# Patient Record
Sex: Female | Born: 1955 | Race: White | Hispanic: No | Marital: Married | Smoking: Former smoker
Health system: Southern US, Community
[De-identification: ages and names within clinical notes are randomized; demographics above are authoritative.]

## PROBLEM LIST (undated history)

## (undated) DIAGNOSIS — F32A Depression, unspecified: Secondary | ICD-10-CM

## (undated) DIAGNOSIS — K219 Gastro-esophageal reflux disease without esophagitis: Secondary | ICD-10-CM

## (undated) DIAGNOSIS — I10 Essential (primary) hypertension: Secondary | ICD-10-CM

## (undated) DIAGNOSIS — J302 Other seasonal allergic rhinitis: Secondary | ICD-10-CM

## (undated) DIAGNOSIS — F329 Major depressive disorder, single episode, unspecified: Secondary | ICD-10-CM

## (undated) DIAGNOSIS — M810 Age-related osteoporosis without current pathological fracture: Secondary | ICD-10-CM

## (undated) DIAGNOSIS — Z9109 Other allergy status, other than to drugs and biological substances: Secondary | ICD-10-CM

## (undated) DIAGNOSIS — T7840XA Allergy, unspecified, initial encounter: Secondary | ICD-10-CM

## (undated) DIAGNOSIS — M1990 Unspecified osteoarthritis, unspecified site: Secondary | ICD-10-CM

## (undated) DIAGNOSIS — Z973 Presence of spectacles and contact lenses: Secondary | ICD-10-CM

## (undated) HISTORY — DX: Allergy, unspecified, initial encounter: T78.40XA

## (undated) HISTORY — PX: OTHER SURGICAL HISTORY: SHX169

## (undated) HISTORY — PX: LAPAROSCOPIC ENDOMETRIOSIS FULGURATION: SUR769

## (undated) HISTORY — PX: POLYPECTOMY: SHX149

## (undated) HISTORY — DX: Age-related osteoporosis without current pathological fracture: M81.0

## (undated) HISTORY — DX: Other allergy status, other than to drugs and biological substances: Z91.09

## (undated) HISTORY — PX: APPENDECTOMY: SHX54

## (undated) HISTORY — PX: COLONOSCOPY: SHX174

---

## 2004-10-08 ENCOUNTER — Emergency Department (HOSPITAL_COMMUNITY): Admission: EM | Admit: 2004-10-08 | Discharge: 2004-10-08 | Payer: Self-pay | Admitting: Emergency Medicine

## 2004-10-09 ENCOUNTER — Observation Stay (HOSPITAL_COMMUNITY): Admission: RE | Admit: 2004-10-09 | Discharge: 2004-10-10 | Payer: Self-pay | Admitting: Orthopedic Surgery

## 2005-02-22 ENCOUNTER — Other Ambulatory Visit: Admission: RE | Admit: 2005-02-22 | Discharge: 2005-02-22 | Payer: Self-pay | Admitting: Obstetrics and Gynecology

## 2005-02-24 ENCOUNTER — Ambulatory Visit (HOSPITAL_COMMUNITY): Admission: RE | Admit: 2005-02-24 | Discharge: 2005-02-24 | Payer: Self-pay | Admitting: Obstetrics and Gynecology

## 2006-08-17 IMAGING — CR DG WRIST 2V*L*
2 series · 2 of 2 positions shown · non-contrast
Comparison: 10/08/04.

CLINICAL DATA: Postop left wrist fracture.  
 LEFT WRIST ? 2 VIEWS:

[view not recorded (1 of 2)]
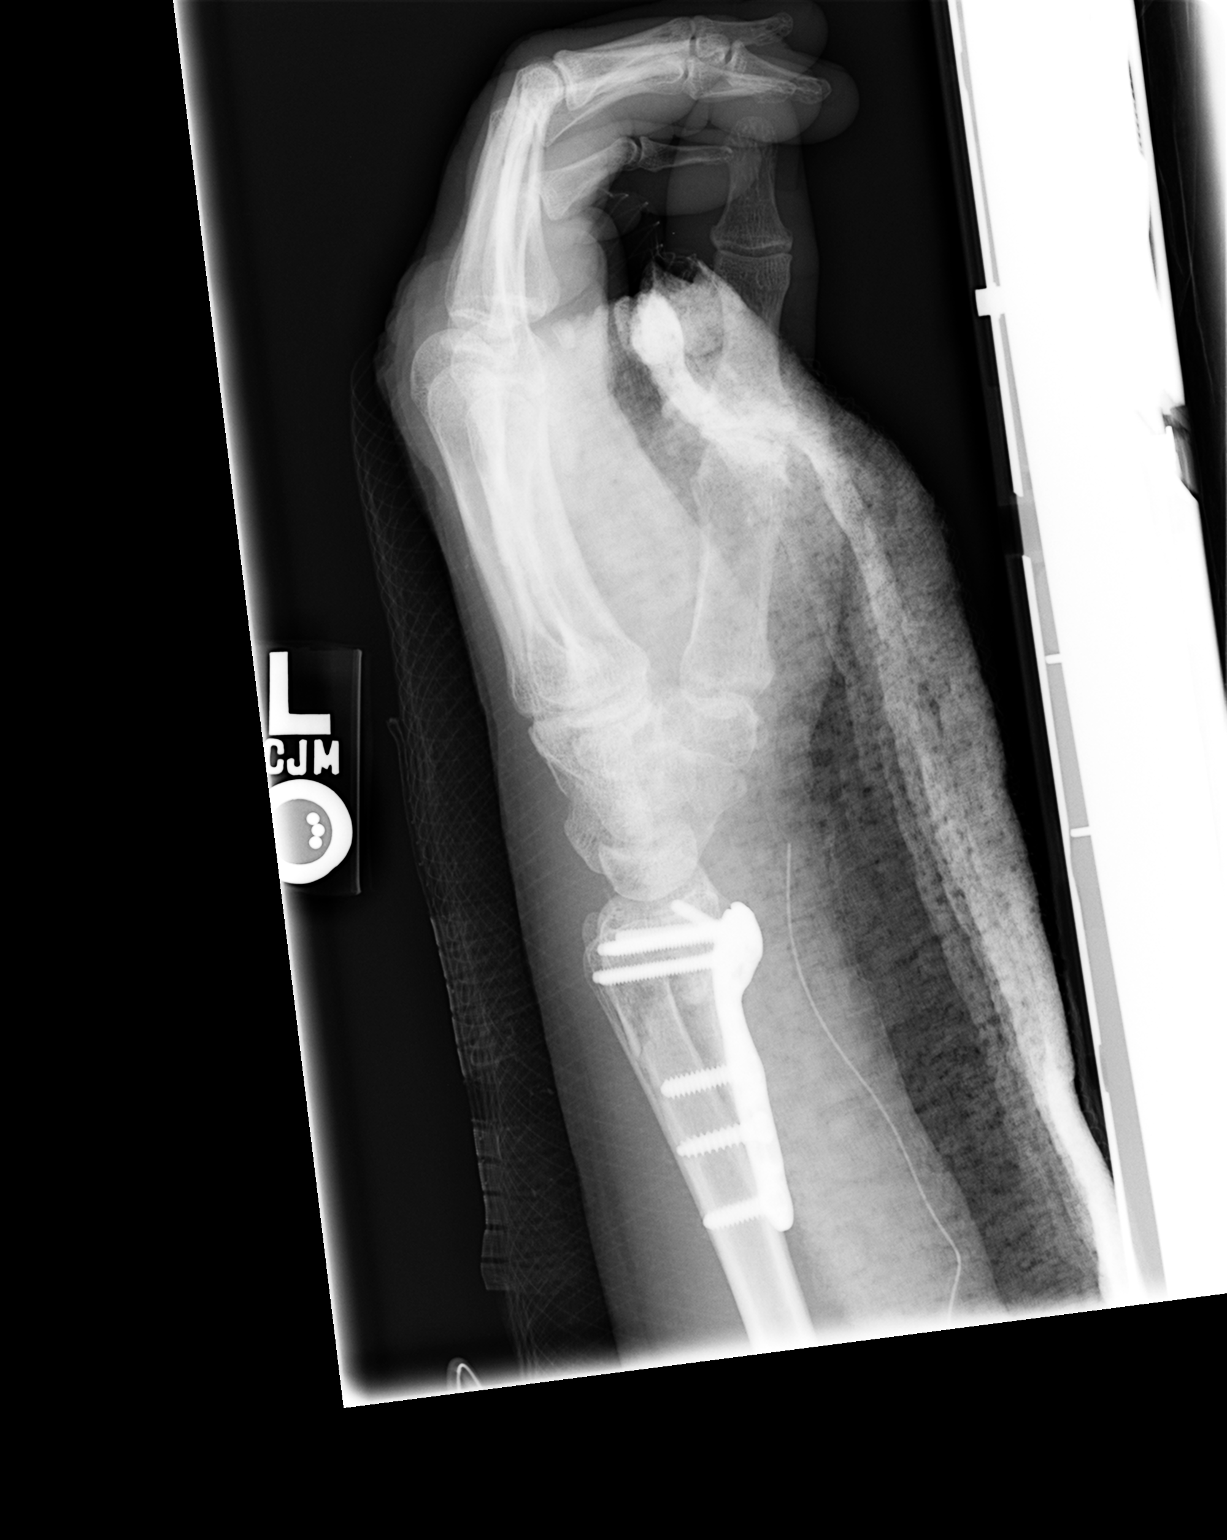

[view not recorded (2 of 2)]
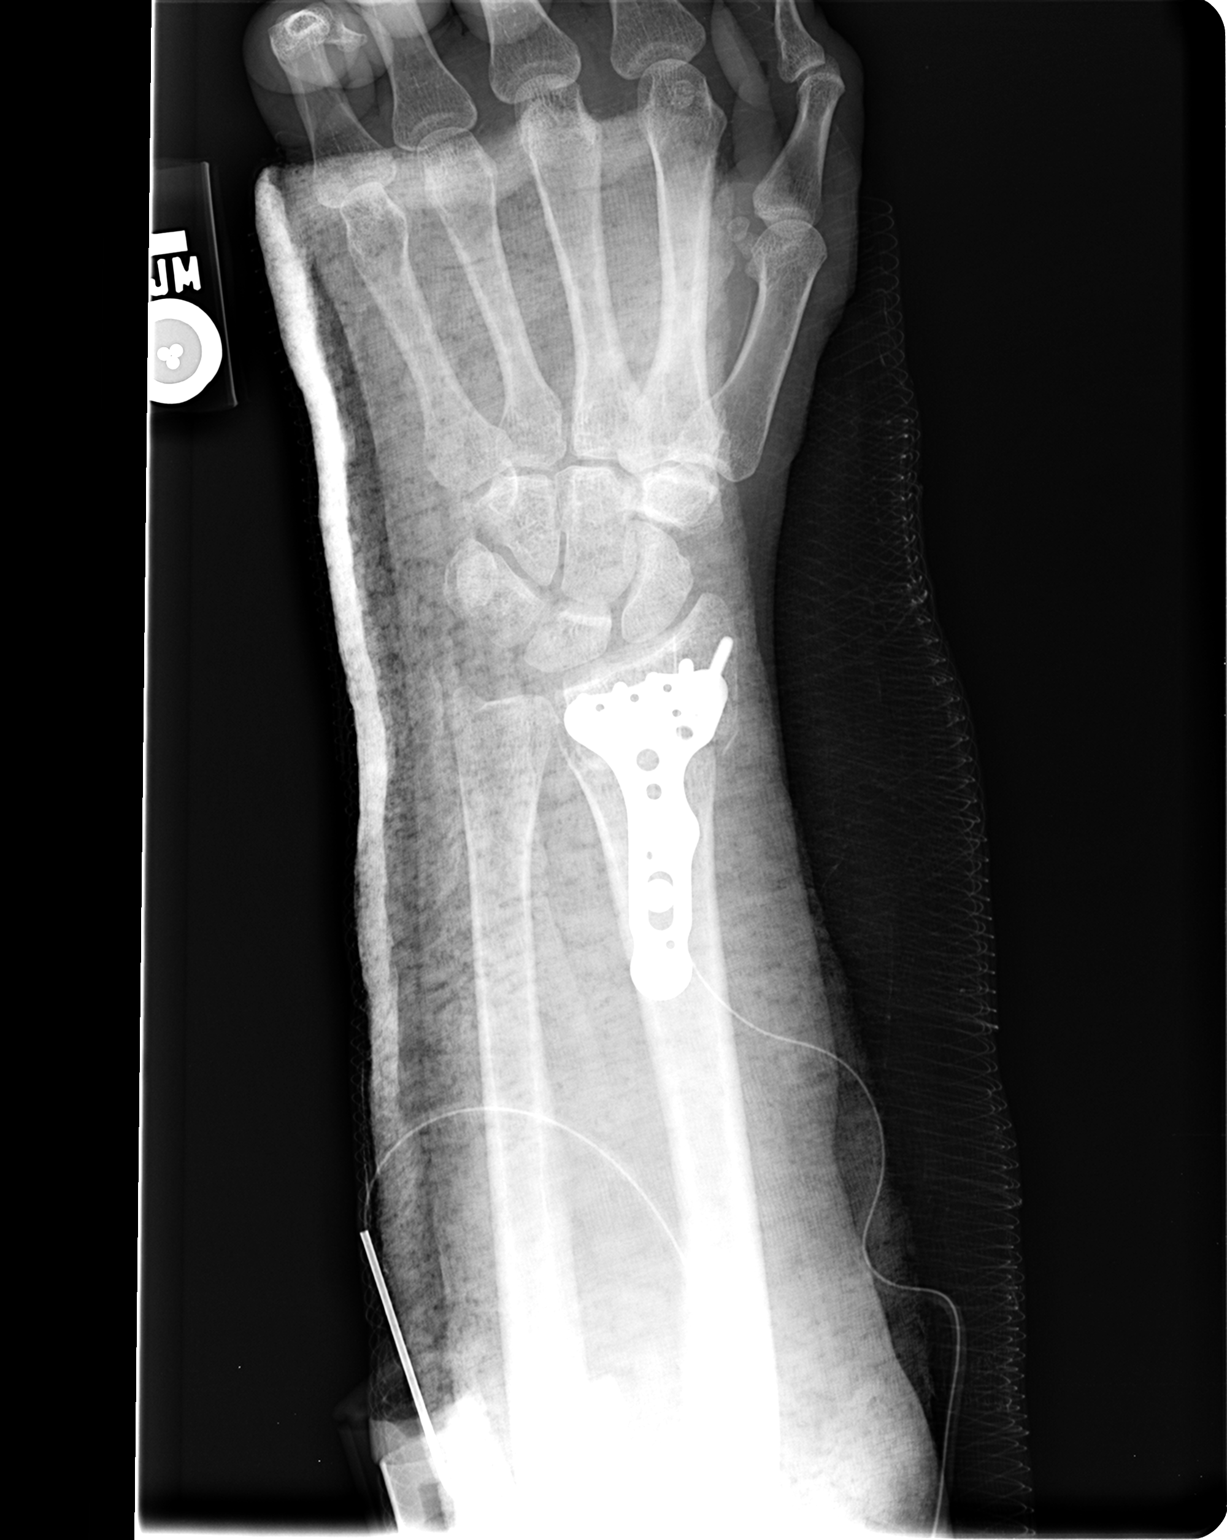

[2 of 2 positions shown; findings below may reference images not displayed]

FINDINGS: There has been interval plate and screw fixation of a distal radius fracture with fracture fragments in near anatomic alignment.  Fine osseous detail is degraded by overlying plaster splint.
IMPRESSION: Interval plate and screw fixation of distal radius fracture as above.

## 2009-12-24 ENCOUNTER — Ambulatory Visit (HOSPITAL_COMMUNITY): Admission: RE | Admit: 2009-12-24 | Discharge: 2009-12-24 | Payer: Self-pay | Admitting: Obstetrics and Gynecology

## 2010-03-04 ENCOUNTER — Encounter
Admission: RE | Admit: 2010-03-04 | Discharge: 2010-03-04 | Payer: Self-pay | Source: Home / Self Care | Attending: Sports Medicine | Admitting: Sports Medicine

## 2010-06-26 NOTE — Consult Note (Signed)
[name], FLORENDO               ACCOUNT NO.:  [id number]   MEDICAL RECORD NO.:  [id number]          PATIENT TYPE:  EMS   LOCATION:  MAJO                         FACILITY:  [facility]   PHYSICIAN:  Burnard Bunting, M.D.    DATE OF BIRTH:  14-Nov-1955   DATE OF CONSULTATION:  10/08/2004  DATE OF DISCHARGE:  10/08/2004                                   CONSULTATION   REQUESTING PHYSICIAN:  Vanessa Mulders, MD   CHIEF COMPLAINT:  Left wrist pain.   HISTORY OF PRESENT ILLNESS:  Vanessa Patel is a 55 year old left hand  dominant female who just moved from [place] who fell down the steps and  injured her left wrist approximately three hours ago.  She denies any other  orthopedic complaints.  She reports wrist pain deformity with mild tingling  in her fingers.  She has no known drug allergies.   CURRENT MEDICATIONS:  Prozac, triamterene, Allegra, Singulair, and Fosamax.   PAST MEDICAL HISTORY:  Osteoporosis.   PAST SURGICAL HISTORY:  Appendectomy.   REVIEW OF SYSTEMS:  Unremarkable.  She just moved here to [place] with  her husband.  She is in temporary work at [employer].   PHYSICAL EXAMINATION:  VITAL SIGNS:  Temperature 97.4, heart rate 73, blood  pressure 122/59, respirations 18, pulse ox 99% on room air.  EXTREMITIES:  Her left wrist has obvious deformity.  She has palpable radial  pulse.  Her fingers are perfuse, although cap refill is delayed to 2-3  seconds.  She has good ETL __________  interosseous function.  She has full  elbow and shoulder range of motion.   Radiographs demonstrated dorsally angulated distal radius fracture.   IMPRESSION:  Dorsally angulated distal radius fracture dominant hand.   PLAN:  Closed reduction is performed tonight under conscious sedation.  After hematoma block of 5 mL of lidocaine is administered manual reduction  is performed with morphine and Versed on-board.  Post reduction radiographs  show restoration of radial height and alignment,  although the fracture  itself was highly unstable.  At this time plan is for volar plating in one  to two days to allow swelling to decrease.  Risks and benefits of that  procedure  also discussed with the patient and her husband.  They include, but are not  limited to, infection, nerve/vessel damage, non-union, malunion.  Patient  understands and will proceed on Friday with open reduction internal  fixation.  Note that sensory function remained intact following the  reduction.           ______________________________  G. Dorene Grebe, M.D.     GSD/MEDQ  D:  10/08/2004  T:  10/08/2004  Job:  [id number]

## 2010-06-26 NOTE — Op Note (Signed)
NAME:  Vanessa Patel, Vanessa Patel               ACCOUNT NO.:  [id number]   MEDICAL RECORD NO.:  [id number]          PATIENT TYPE:  OBV   LOCATION:  5008                         FACILITY:  [facility]   PHYSICIAN:  Burnard Bunting, M.D.    DATE OF BIRTH:  [birth year]   DATE OF PROCEDURE:  10/09/2004  DATE OF DISCHARGE:                                 OPERATIVE REPORT   PREOPERATIVE DIAGNOSIS:  Left distal radius fracture.   POSTOPERATIVE DIAGNOSIS:  Left distal radius fracture.   PROCEDURE:  Left distal radius open reduction/internal fixation.   SURGEON:  Burnard Bunting, M.D.   ASSISTANT:  None.   ANESTHESIA:  Regional.   TOURNIQUET TIME:  Sixty-one minutes at 250 mmHg.   PROCEDURE IN DETAIL:  Patient was brought to the operating room, where  general endotracheal anesthesia was induced.  Preoperative antibiotics were  administered.  The left arm was then prepped with DuraPrep solution and  draped in a sterile manner.  The arm was elevated, and exsanguinated with  the esmarch wrap.  The tourniquet was inflated.  The operative field was  covered with Ioban.  An incision was made along the SCR tendon.  Skin and  subcutaneous tissue were sharply divided.  Crossing veins were ligated with  bipolar electrocautery.  The floor of the SCR sheath was also divided.  Pronator quadratus attachment to the radial aspect of the distal radius was  then detached.  Subperiosteal elevation was performed to visualize the  fracture site.  The radial artery and SCR tendon were retracted radially.  The fracture site was visualized.  The fracture was unstable but reducible.  The fracture was reduced, and then Accumed plate was then applied with a  bicortical screw through the sliding proximal slot hole.  Optimal placements  of the plate on the bone with reduction in places achieved, and the screw  was tightened.  While maintaining the reduction, two screws were placed into  the distal fragment.  These were locking screws  into the plate.  Reduction  was again confirmed in the AP and lateral planes under fluoroscopy, and it  was found to have restoration of radial height __________ in lower tilt.  At  this time, the remaining distal fragment screw holes were filled with  locking screws.  Two proximal locking screws were also placed.  The  tourniquet was released at this time.  The incision was thoroughly  irrigated.  The wrist was taken through a range of motion, flexion,  extension, pronation, and supination and were found to have full motion.  The incision was then closed over a TLS drain using __________ inverted 3-0  Vicryl suture and 3-0 nylon suture.  A well-padded volar splint was applied.  The patient tolerated the procedure well without immediate complications.  She was transferred to the recovery room in stable condition.          ______________________________  G. Dorene Grebe, M.D.    GSD/MEDQ  D:  10/09/2004  T:  10/10/2004  Job:  [id number]

## 2010-11-12 ENCOUNTER — Encounter: Payer: Self-pay | Admitting: Internal Medicine

## 2010-11-23 ENCOUNTER — Ambulatory Visit (AMBULATORY_SURGERY_CENTER): Payer: BC Managed Care – PPO

## 2010-11-23 ENCOUNTER — Encounter: Payer: Self-pay | Admitting: Internal Medicine

## 2010-11-23 VITALS — Ht 62.0 in | Wt 135.0 lb

## 2010-11-23 DIAGNOSIS — Z1211 Encounter for screening for malignant neoplasm of colon: Secondary | ICD-10-CM

## 2010-11-23 MED ORDER — PEG-KCL-NACL-NASULF-NA ASC-C 100 G PO SOLR: 1.0000 | Freq: Once | ORAL | Status: DC

## 2010-12-07 ENCOUNTER — Encounter: Payer: Self-pay | Admitting: Internal Medicine

## 2010-12-07 ENCOUNTER — Ambulatory Visit (AMBULATORY_SURGERY_CENTER): Payer: BC Managed Care – PPO | Admitting: Internal Medicine

## 2010-12-07 DIAGNOSIS — D128 Benign neoplasm of rectum: Secondary | ICD-10-CM

## 2010-12-07 DIAGNOSIS — D129 Benign neoplasm of anus and anal canal: Secondary | ICD-10-CM

## 2010-12-07 DIAGNOSIS — Z1211 Encounter for screening for malignant neoplasm of colon: Secondary | ICD-10-CM

## 2010-12-07 DIAGNOSIS — D126 Benign neoplasm of colon, unspecified: Secondary | ICD-10-CM

## 2010-12-07 MED ORDER — SODIUM CHLORIDE 0.9 % IV SOLN: 500.0000 mL | INTRAVENOUS | Status: DC

## 2010-12-07 NOTE — Patient Instructions (Signed)
FOLLOW DISCHARGE INSTRUCTIONS (BLUE & GREEN SHEETS).   INFORMATION ON POLYPS, DIVERTICULOSIS, & HIGH FIBER DIET GIVEN TO YOU   AWAIT LETTER FROM DR PYRTLE WITH PATHOLOGY RESULTS.

## 2010-12-08 ENCOUNTER — Telehealth: Payer: Self-pay | Admitting: *Deleted

## 2010-12-08 NOTE — Telephone Encounter (Signed)

## 2010-12-13 ENCOUNTER — Encounter: Payer: Self-pay | Admitting: Internal Medicine

## 2011-01-14 ENCOUNTER — Other Ambulatory Visit (HOSPITAL_COMMUNITY): Payer: Self-pay | Admitting: Obstetrics and Gynecology

## 2011-01-14 DIAGNOSIS — Z1231 Encounter for screening mammogram for malignant neoplasm of breast: Secondary | ICD-10-CM

## 2011-02-09 HISTORY — PX: OTHER SURGICAL HISTORY: SHX169

## 2011-02-19 ENCOUNTER — Ambulatory Visit (HOSPITAL_COMMUNITY)
Admission: RE | Admit: 2011-02-19 | Discharge: 2011-02-19 | Disposition: A | Discharge status: Home / Self Care | Payer: BC Managed Care – PPO | Source: Ambulatory Visit | Attending: Obstetrics and Gynecology | Admitting: Obstetrics and Gynecology

## 2011-02-19 DIAGNOSIS — Z1231 Encounter for screening mammogram for malignant neoplasm of breast: Secondary | ICD-10-CM

## 2011-02-25 ENCOUNTER — Other Ambulatory Visit: Payer: Self-pay | Admitting: Obstetrics and Gynecology

## 2011-02-25 DIAGNOSIS — R928 Other abnormal and inconclusive findings on diagnostic imaging of breast: Secondary | ICD-10-CM

## 2011-03-04 ENCOUNTER — Other Ambulatory Visit: Payer: BC Managed Care – PPO

## 2011-03-08 ENCOUNTER — Ambulatory Visit
Admission: RE | Admit: 2011-03-08 | Discharge: 2011-03-08 | Disposition: A | Discharge status: Home / Self Care | Payer: BC Managed Care – PPO | Source: Ambulatory Visit | Attending: Obstetrics and Gynecology | Admitting: Obstetrics and Gynecology

## 2011-03-08 DIAGNOSIS — R928 Other abnormal and inconclusive findings on diagnostic imaging of breast: Secondary | ICD-10-CM

## 2011-06-03 ENCOUNTER — Other Ambulatory Visit: Payer: Self-pay | Admitting: Orthopedic Surgery

## 2011-06-04 ENCOUNTER — Encounter (HOSPITAL_BASED_OUTPATIENT_CLINIC_OR_DEPARTMENT_OTHER): Payer: Self-pay | Admitting: *Deleted

## 2011-06-04 NOTE — Progress Notes (Signed)
To come in for ekg and bmet 

## 2011-06-07 ENCOUNTER — Encounter (HOSPITAL_BASED_OUTPATIENT_CLINIC_OR_DEPARTMENT_OTHER)
Admission: RE | Admit: 2011-06-07 | Discharge: 2011-06-07 | Disposition: A | Discharge status: Home / Self Care | Payer: BC Managed Care – PPO | Source: Ambulatory Visit | Attending: Orthopedic Surgery | Admitting: Orthopedic Surgery

## 2011-06-07 LAB — BASIC METABOLIC PANEL
BUN: 13 mg/dL (ref 6–23)
Chloride: 92 mEq/L — ABNORMAL LOW (ref 96–112)
Creatinine, Ser: 0.67 mg/dL (ref 0.50–1.10)
GFR calc Af Amer: >90 mL/min (ref >90)

## 2011-06-08 ENCOUNTER — Encounter (HOSPITAL_BASED_OUTPATIENT_CLINIC_OR_DEPARTMENT_OTHER): Payer: Self-pay | Admitting: Orthopedic Surgery

## 2011-06-08 ENCOUNTER — Ambulatory Visit (HOSPITAL_BASED_OUTPATIENT_CLINIC_OR_DEPARTMENT_OTHER)
Admission: RE | Admit: 2011-06-08 | Discharge: 2011-06-08 | Disposition: A | Discharge status: Home / Self Care | Payer: BC Managed Care – PPO | Source: Ambulatory Visit | Attending: Orthopedic Surgery | Admitting: Orthopedic Surgery

## 2011-06-08 ENCOUNTER — Ambulatory Visit (HOSPITAL_BASED_OUTPATIENT_CLINIC_OR_DEPARTMENT_OTHER): Payer: BC Managed Care – PPO | Admitting: Certified Registered Nurse Anesthetist

## 2011-06-08 ENCOUNTER — Encounter (HOSPITAL_BASED_OUTPATIENT_CLINIC_OR_DEPARTMENT_OTHER)
Admission: RE | Disposition: A | Discharge status: Home / Self Care | Payer: Self-pay | Source: Ambulatory Visit | Attending: Orthopedic Surgery

## 2011-06-08 ENCOUNTER — Encounter (HOSPITAL_BASED_OUTPATIENT_CLINIC_OR_DEPARTMENT_OTHER): Payer: Self-pay | Admitting: Certified Registered Nurse Anesthetist

## 2011-06-08 DIAGNOSIS — I10 Essential (primary) hypertension: Secondary | ICD-10-CM | POA: Insufficient documentation

## 2011-06-08 DIAGNOSIS — M129 Arthropathy, unspecified: Secondary | ICD-10-CM | POA: Insufficient documentation

## 2011-06-08 DIAGNOSIS — F3289 Other specified depressive episodes: Secondary | ICD-10-CM | POA: Insufficient documentation

## 2011-06-08 DIAGNOSIS — Z472 Encounter for removal of internal fixation device: Secondary | ICD-10-CM | POA: Insufficient documentation

## 2011-06-08 DIAGNOSIS — K219 Gastro-esophageal reflux disease without esophagitis: Secondary | ICD-10-CM | POA: Insufficient documentation

## 2011-06-08 DIAGNOSIS — Z01812 Encounter for preprocedural laboratory examination: Secondary | ICD-10-CM | POA: Insufficient documentation

## 2011-06-08 DIAGNOSIS — M66339 Spontaneous rupture of flexor tendons, unspecified forearm: Secondary | ICD-10-CM | POA: Insufficient documentation

## 2011-06-08 DIAGNOSIS — F329 Major depressive disorder, single episode, unspecified: Secondary | ICD-10-CM | POA: Insufficient documentation

## 2011-06-08 HISTORY — DX: Essential (primary) hypertension: I10

## 2011-06-08 HISTORY — PX: TENDON REPAIR: SHX5111

## 2011-06-08 HISTORY — DX: Major depressive disorder, single episode, unspecified: F32.9

## 2011-06-08 HISTORY — DX: Depression, unspecified: F32.A

## 2011-06-08 HISTORY — PX: HARDWARE REMOVAL: SHX979

## 2011-06-08 HISTORY — DX: Other seasonal allergic rhinitis: J30.2

## 2011-06-08 HISTORY — DX: Gastro-esophageal reflux disease without esophagitis: K21.9

## 2011-06-08 HISTORY — DX: Unspecified osteoarthritis, unspecified site: M19.90

## 2011-06-08 SURGERY — TENDON REPAIR
Anesthesia: Monitor Anesthesia Care | Site: Wrist | Laterality: Left | Wound class: Clean

## 2011-06-08 MED ORDER — MIDAZOLAM HCL 5 MG/5ML IJ SOLN
INTRAMUSCULAR | Status: DC | PRN
  Administered 2011-06-08: 1 mg via INTRAVENOUS

## 2011-06-08 MED ORDER — MIDAZOLAM HCL 2 MG/2ML IJ SOLN
1.0000 mg | INTRAMUSCULAR | Status: DC | PRN
  Administered 2011-06-08: 2 mg via INTRAVENOUS

## 2011-06-08 MED ORDER — BUPIVACAINE-EPINEPHRINE PF 0.5-1:200000 % IJ SOLN
INTRAMUSCULAR | Status: DC | PRN
  Administered 2011-06-08: 150 mg

## 2011-06-08 MED ORDER — CHLORHEXIDINE GLUCONATE 4 % EX LIQD: 60.0000 mL | Freq: Once | CUTANEOUS | Status: DC

## 2011-06-08 MED ORDER — ONDANSETRON HCL 4 MG/2ML IJ SOLN
INTRAMUSCULAR | Status: DC | PRN
  Administered 2011-06-08: 4 mg via INTRAVENOUS

## 2011-06-08 MED ORDER — DROPERIDOL 2.5 MG/ML IJ SOLN: 0.6250 mg | INTRAMUSCULAR | Status: DC | PRN

## 2011-06-08 MED ORDER — LACTATED RINGERS IV SOLN
INTRAVENOUS | Status: DC
  Administered 2011-06-08 (×2): via INTRAVENOUS

## 2011-06-08 MED ORDER — OXYCODONE-ACETAMINOPHEN 5-325 MG PO TABS: ORAL_TABLET | ORAL | Status: AC

## 2011-06-08 MED ORDER — FENTANYL CITRATE 0.05 MG/ML IJ SOLN
50.0000 ug | INTRAMUSCULAR | Status: DC | PRN
  Administered 2011-06-08: 100 ug via INTRAVENOUS

## 2011-06-08 MED ORDER — PROPOFOL 10 MG/ML IV EMUL
INTRAVENOUS | Status: DC | PRN
  Administered 2011-06-08: 200 mg via INTRAVENOUS

## 2011-06-08 MED ORDER — CEFAZOLIN SODIUM 1-5 GM-% IV SOLN
1.0000 g | INTRAVENOUS | Status: AC
  Administered 2011-06-08: 1 g via INTRAVENOUS

## 2011-06-08 MED ORDER — HYDROMORPHONE HCL PF 1 MG/ML IJ SOLN: 0.2500 mg | INTRAMUSCULAR | Status: DC | PRN

## 2011-06-08 MED ORDER — DEXAMETHASONE SODIUM PHOSPHATE 10 MG/ML IJ SOLN
INTRAMUSCULAR | Status: DC | PRN
  Administered 2011-06-08: 10 mg via INTRAVENOUS

## 2011-06-08 SURGICAL SUPPLY — 92 items
BAG DECANTER FOR FLEXI CONT (MISCELLANEOUS) IMPLANT
BANDAGE CONFORM 2  STR LF (GAUZE/BANDAGES/DRESSINGS) IMPLANT
BANDAGE ELASTIC 3 VELCRO ST LF (GAUZE/BANDAGES/DRESSINGS) ×2 IMPLANT
BANDAGE GAUZE ELAST BULKY 4 IN (GAUZE/BANDAGES/DRESSINGS) ×2 IMPLANT
BLADE MINI RND TIP GREEN BEAV (BLADE) IMPLANT
BLADE SURG 15 STRL LF DISP TIS (BLADE) ×2 IMPLANT
BLADE SURG 15 STRL SS (BLADE) ×4
BNDG CMPR 9X4 STRL LF SNTH (GAUZE/BANDAGES/DRESSINGS) ×1
BNDG CMPR MD 5X2 ELC HKLP STRL (GAUZE/BANDAGES/DRESSINGS)
BNDG COHESIVE 1X5 TAN STRL LF (GAUZE/BANDAGES/DRESSINGS) IMPLANT
BNDG ELASTIC 2 VLCR STRL LF (GAUZE/BANDAGES/DRESSINGS) IMPLANT
BNDG ESMARK 4X9 LF (GAUZE/BANDAGES/DRESSINGS) ×2 IMPLANT
CHLORAPREP W/TINT 26ML (MISCELLANEOUS) ×2 IMPLANT
CLOTH BEACON ORANGE TIMEOUT ST (SAFETY) ×2 IMPLANT
CORDS BIPOLAR (ELECTRODE) ×2 IMPLANT
COTTONBALL LRG STERILE PKG (GAUZE/BANDAGES/DRESSINGS) IMPLANT
COVER MAYO STAND STRL (DRAPES) ×2 IMPLANT
COVER TABLE BACK 60X90 (DRAPES) ×2 IMPLANT
CUFF TOURNIQUET SINGLE 18IN (TOURNIQUET CUFF) ×2 IMPLANT
DECANTER SPIKE VIAL GLASS SM (MISCELLANEOUS) IMPLANT
DRAIN TLS ROUND 10FR (DRAIN) IMPLANT
DRAPE EXTREMITY T 121X128X90 (DRAPE) ×2 IMPLANT
DRAPE OEC MINIVIEW 54X84 (DRAPES) ×2 IMPLANT
DRAPE SURG 17X23 STRL (DRAPES) ×2 IMPLANT
DRSG PAD ABDOMINAL 8X10 ST (GAUZE/BANDAGES/DRESSINGS) IMPLANT
GAUZE SPONGE 4X4 16PLY XRAY LF (GAUZE/BANDAGES/DRESSINGS) ×2 IMPLANT
GAUZE XEROFORM 1X8 LF (GAUZE/BANDAGES/DRESSINGS) ×2 IMPLANT
GLOVE BIO SURGEON STRL SZ 6.5 (GLOVE) ×2 IMPLANT
GLOVE BIO SURGEON STRL SZ7 (GLOVE) ×2 IMPLANT
GLOVE BIO SURGEON STRL SZ7.5 (GLOVE) ×2 IMPLANT
GLOVE BIOGEL PI IND STRL 7.5 (GLOVE) ×1 IMPLANT
GLOVE BIOGEL PI IND STRL 8 (GLOVE) ×1 IMPLANT
GLOVE BIOGEL PI INDICATOR 7.5 (GLOVE) ×1
GLOVE BIOGEL PI INDICATOR 8 (GLOVE) ×1
GLOVE ECLIPSE 6.5 STRL STRAW (GLOVE) ×2 IMPLANT
GLOVE SURG ORTHO 8.0 STRL STRW (GLOVE) ×4 IMPLANT
GOWN PREVENTION PLUS XLARGE (GOWN DISPOSABLE) ×6 IMPLANT
GOWN PREVENTION PLUS XXLARGE (GOWN DISPOSABLE) IMPLANT
GOWN STRL REIN XL XLG (GOWN DISPOSABLE) ×4 IMPLANT
KWIRE 4.0 X .035IN (WIRE) IMPLANT
LOOP VESSEL MAXI BLUE (MISCELLANEOUS) IMPLANT
NEEDLE HYPO 22GX1.5 SAFETY (NEEDLE) IMPLANT
NEEDLE HYPO 25X1 1.5 SAFETY (NEEDLE) IMPLANT
NEEDLE KEITH (NEEDLE) IMPLANT
NS IRRIG 1000ML POUR BTL (IV SOLUTION) ×2 IMPLANT
PACK BASIN DAY SURGERY FS (CUSTOM PROCEDURE TRAY) ×2 IMPLANT
PAD CAST 3X4 CTTN HI CHSV (CAST SUPPLIES) ×1 IMPLANT
PAD CAST 4YDX4 CTTN HI CHSV (CAST SUPPLIES) IMPLANT
PADDING CAST ABS 3INX4YD NS (CAST SUPPLIES)
PADDING CAST ABS 4INX4YD NS (CAST SUPPLIES) ×1
PADDING CAST ABS COTTON 3X4 (CAST SUPPLIES) IMPLANT
PADDING CAST ABS COTTON 4X4 ST (CAST SUPPLIES) ×1 IMPLANT
PADDING CAST COTTON 3X4 STRL (CAST SUPPLIES) ×2
PADDING CAST COTTON 4X4 STRL (CAST SUPPLIES)
SLEEVE SCD COMPRESS KNEE MED (MISCELLANEOUS) ×2 IMPLANT
SPLINT PLASTER CAST XFAST 3X15 (CAST SUPPLIES) ×20 IMPLANT
SPLINT PLASTER CAST XFAST 4X15 (CAST SUPPLIES) IMPLANT
SPLINT PLASTER XTRA FAST SET 4 (CAST SUPPLIES)
SPLINT PLASTER XTRA FASTSET 3X (CAST SUPPLIES) ×20
SPONGE GAUZE 4X4 12PLY (GAUZE/BANDAGES/DRESSINGS) ×2 IMPLANT
STOCKINETTE 4X48 STRL (DRAPES) ×2 IMPLANT
STRIP CLOSURE SKIN 1/4X4 (GAUZE/BANDAGES/DRESSINGS) IMPLANT
SUT CHROMIC 5 0 P 3 (SUTURE) IMPLANT
SUT ETHIBOND 3-0 V-5 (SUTURE) IMPLANT
SUT ETHILON 3 0 PS 1 (SUTURE) ×2 IMPLANT
SUT ETHILON 4 0 PS 2 18 (SUTURE) ×4 IMPLANT
SUT FIBERWIRE 3-0 18 TAPR NDL (SUTURE)
SUT FIBERWIRE 4-0 18 DIAM BLUE (SUTURE)
SUT MERSILENE 2.0 SH NDLE (SUTURE) IMPLANT
SUT MERSILENE 3 0 FS 1 (SUTURE) IMPLANT
SUT MERSILENE 4 0 P 3 (SUTURE) IMPLANT
SUT MNCRL AB 4-0 PS2 18 (SUTURE) IMPLANT
SUT POLY BUTTON 15MM (SUTURE) IMPLANT
SUT PROLENE 2 0 SH DA (SUTURE) IMPLANT
SUT PROLENE 6 0 P 1 18 (SUTURE) IMPLANT
SUT SILK 2 0 FS (SUTURE) IMPLANT
SUT SILK 4 0 PS 2 (SUTURE) IMPLANT
SUT STEEL 4 0 V 26 (SUTURE) IMPLANT
SUT VIC AB 3-0 FS2 27 (SUTURE) IMPLANT
SUT VIC AB 3-0 PS1 18 (SUTURE)
SUT VIC AB 3-0 PS1 18XBRD (SUTURE) IMPLANT
SUT VIC AB 4-0 P-3 18XBRD (SUTURE) IMPLANT
SUT VIC AB 4-0 P3 18 (SUTURE)
SUT VICRYL 4-0 PS2 18IN ABS (SUTURE) IMPLANT
SUTURE FIBERWR 3-0 18 TAPR NDL (SUTURE) IMPLANT
SUTURE FIBERWR 4-0 18 DIA BLUE (SUTURE) IMPLANT
SYR BULB 3OZ (MISCELLANEOUS) ×2 IMPLANT
SYR CONTROL 10ML LL (SYRINGE) IMPLANT
TOWEL OR 17X24 6PK STRL BLUE (TOWEL DISPOSABLE) ×4 IMPLANT
TUBE FEEDING 5FR 15 INCH (TUBING) IMPLANT
UNDERPAD 30X30 INCONTINENT (UNDERPADS AND DIAPERS) ×2 IMPLANT
WATER STERILE IRR 1000ML POUR (IV SOLUTION) IMPLANT

## 2011-06-08 NOTE — H&P (Signed)
  Vanessa Patel is an 56 y.o. female.   Chief Complaint: unable to flex thumb HPI: 56 yo lhd female unable to flex left thumb.  Had left distal radius ORIF in 2006.  No problems since.  Had pain at one instant, then unable to flex thumb.  Now no pain.  Past Medical History  Diagnosis Date  . Environmental allergies   . Hypertension   . GERD (gastroesophageal reflux disease)   . Seasonal allergies   . Depression   . Arthritis     Past Surgical History  Procedure Date  . Left wrist     2007 left wrist plate fracture  . Appendectomy   . Colonoscopy     Family History  Problem Relation Age of Onset  . Colon cancer Neg Hx    Social History:  reports that she quit smoking about 20 years ago. She has never used smokeless tobacco. She reports that she drinks about 1.5 ounces of alcohol per week. She reports that she does not use illicit drugs.  Allergies: No Known Allergies  No prescriptions prior to admission    Results for orders placed during the hospital encounter of 06/08/11 (from the past 48 hour(s))  BASIC METABOLIC PANEL     Status: Abnormal   Collection Time   06/07/11  9:00 AM      Component Value Range Comment   Sodium 131 (*) 135 - 145 (mEq/L)    Potassium 4.3  3.5 - 5.1 (mEq/L)    Chloride 92 (*) 96 - 112 (mEq/L)    CO2 28  19 - 32 (mEq/L)    Glucose, Bld 93  70 - 99 (mg/dL)    BUN 13  6 - 23 (mg/dL)    Creatinine, Ser 4.78  0.50 - 1.10 (mg/dL)    Calcium 9.4  8.4 - 10.5 (mg/dL)    GFR calc non Af Amer >90  >90 (mL/min)    GFR calc Af Amer >90  >90 (mL/min)     No results found.   A comprehensive review of systems was negative except for: Eyes: positive for contacts/glasses  There were no vitals taken for this visit.  General appearance: alert, cooperative and appears stated age Head: Normocephalic, without obvious abnormality, atraumatic Neck: supple, symmetrical, trachea midline Resp: clear to auscultation bilaterally Cardio: regular rate and  rhythm GI: soft, non-tender; bowel sounds normal; no masses,  no organomegaly Extremities: light touch sensation and capillary refill intact all digits.  unable to flex left thumb at IP joint.  otherwise +epl/fpl/io.  no wounds, no ttp.  well healed surgical scar volar left wrist.  no tenodesis of left thumb ip in flexion. Pulses: 2+ and symmetric Skin: Skin color, texture, turgor normal. No rashes or lesions Neurologic: Grossly normal Incision/Wound: na  Assessment/Plan Left thumb fpl rupture s/p volar distal radius plating.  Discussed nature of condition with patient.  Discussed treatment options including acceptance vs. Removal hardware, reconstruction fpl.  patient elected surgical reconstruction.  Risks, benefits, and alternatives of surgery were discussed and the patient agrees with the plan of care.   Josy Peaden R 06/08/2011, 9:21 AM

## 2011-06-08 NOTE — Transfer of Care (Signed)
Immediate Anesthesia Transfer of Care Note  Patient: Vanessa Patel  Procedure(s) Performed: Procedure(s) (LRB): TENDON REPAIR (Left) HARDWARE REMOVAL (Left)  Patient Location: PACU  Anesthesia Type: General and Regional  Level of Consciousness: awake, alert , oriented and patient cooperative  Airway & Oxygen Therapy: Patient Spontanous Breathing and Patient connected to face mask oxygen  Post-op Assessment: Report given to PACU RN and Post -op Vital signs reviewed and stable  Post vital signs: Reviewed and stable  Complications: No apparent anesthesia complications

## 2011-06-08 NOTE — Discharge Instructions (Addendum)
[facility] Instructions °Hand Surgery ° °Wound Care: °Keep your hand elevated above the level of your heart.  Do not allow it to dangle by your side.  Keep the dressing dry and do not remove it unless your doctor advises you to do so.  He will usually change it at the time of your post-op visit.  Moving your fingers is advised to stimulate circulation but will depend on the site of your surgery.  If you have a splint applied, your doctor will advise you regarding movement. ° °Activity: °Do not drive or operate machinery today.  Rest today and then you may return to your normal activity and work as indicated by your physician. ° °Diet:  °Drink liquids today or eat a light diet.  You may resume a regular diet tomorrow.   ° °General expectations: °Pain for two to three days. °Fingers may become slightly swollen. ° °Call your doctor if any of the following occur: °Severe pain not relieved by pain medication. °Elevated temperature. °Dressing soaked with blood. °Inability to move fingers. °White or bluish color to fingers. ° ° °Post Anesthesia Home Care Instructions ° °Activity: °Get plenty of rest for the remainder of the day. A responsible adult should stay with you for 24 hours following the procedure.  °For the next 24 hours, DO NOT: °-Drive a car °-Operate machinery °-Drink alcoholic beverages °-Take any medication unless instructed by your physician °-Make any legal decisions or sign important papers. ° °Meals: °Start with liquid foods such as gelatin or soup. Progress to regular foods as tolerated. Avoid greasy, spicy, heavy foods. If nausea and/or vomiting occur, drink only clear liquids until the nausea and/or vomiting subsides. Call your physician if vomiting continues. ° °Special Instructions/Symptoms: °Your throat may feel dry or sore from the anesthesia or the breathing tube placed in your throat during surgery. If this causes discomfort, gargle with warm salt water. The discomfort should disappear within 24  hours. ° ° °Regional Anesthesia Blocks ° °1. Numbness or the inability to move the "blocked" extremity may last from 3-48 hours after placement. The length of time depends on the medication injected and your individual response to the medication. If the numbness is not going away after 48 hours, call your surgeon. ° °2. The extremity that is blocked will need to be protected until the numbness is gone and the  Strength has returned. Because you cannot feel it, you will need to take extra care to avoid injury. Because it may be weak, you may have difficulty moving it or using it. You may not know what position it is in without looking at it while the block is in effect. ° °3. For blocks in the legs and feet, returning to weight bearing and walking needs to be done carefully. You will need to wait until the numbness is entirely gone and the strength has returned. You should be able to move your leg and foot normally before you try and bear weight or walk. You will need someone to be with you when you first try to ensure you do not fall and possibly risk injury. ° °4. Bruising and tenderness at the needle site are common side effects and will resolve in a few days. ° °5. Persistent numbness or new problems with movement should be communicated to the surgeon or the [facility] ([phone])/ [facility] ([phone]). °

## 2011-06-08 NOTE — Progress Notes (Signed)
Assisted Dr. Singer with left, ultrasound guided, supraclavicular block. Side rails up, monitors on throughout procedure. See vital signs in flow sheet. Tolerated Procedure well. 

## 2011-06-08 NOTE — Anesthesia Preprocedure Evaluation (Addendum)
Anesthesia Evaluation  Patient identified by MRN, date of birth, ID band Patient awake    Reviewed: Allergy & Precautions, H&P , NPO status , Patient's Chart, lab work & pertinent test results  History of Anesthesia Complications Negative for: history of anesthetic complications  Airway Mallampati: I TM Distance: >3 FB Neck ROM: Full    Dental  (+) Teeth Intact and Dental Advisory Given   Pulmonary neg pulmonary ROS,  breath sounds clear to auscultation  Pulmonary exam normal       Cardiovascular hypertension, Pt. on medications Rhythm:Regular Rate:Normal     Neuro/Psych Depression negative neurological ROS     GI/Hepatic Neg liver ROS, GERD-  Medicated and Controlled,  Endo/Other  negative endocrine ROS  Renal/GU negative Renal ROS     Musculoskeletal   Abdominal   Peds  Hematology negative hematology ROS (+)   Anesthesia Other Findings   Reproductive/Obstetrics                           Anesthesia Physical Anesthesia Plan  ASA: II  Anesthesia Plan: General   Post-op Pain Management:    Induction: Intravenous  Airway Management Planned: LMA  Additional Equipment:   Intra-op Plan:   Post-operative Plan: Extubation in OR  Informed Consent: I have reviewed the patients History and Physical, chart, labs and discussed the procedure including the risks, benefits and alternatives for the proposed anesthesia with the patient or authorized representative who has indicated his/her understanding and acceptance.   Dental advisory given  Plan Discussed with: Anesthesiologist, Surgeon and CRNA  Anesthesia Plan Comments:        Anesthesia Quick Evaluation

## 2011-06-08 NOTE — Anesthesia Postprocedure Evaluation (Signed)
Anesthesia Post Note  Patient: Vanessa Patel  Procedure(s) Performed: Procedure(s) (LRB): TENDON REPAIR (Left) HARDWARE REMOVAL (Left)  Anesthesia type: general  Patient location: PACU  Post pain: Pain level controlled  Post assessment: Patient's Cardiovascular Status Stable  Last Vitals:  Filed Vitals:   06/08/11 1248  BP:   Pulse: 89  Temp:   Resp: 15    Post vital signs: Reviewed and stable  Level of consciousness: sedated  Complications: No apparent anesthesia complications

## 2011-06-08 NOTE — Anesthesia Procedure Notes (Addendum)
Anesthesia Regional Block:  Supraclavicular block  Pre-Anesthetic Checklist: ,, timeout performed, Correct Patient, Correct Site, Correct Laterality, Correct Procedure,, site marked, risks and benefits discussed, Surgical consent,  Pre-op evaluation,  At surgeon's request and post-op pain management  Laterality: Left  Prep: chloraprep       Needles:  Injection technique: Single-shot  Needle Type: Echogenic Stimulator Needle     Needle Length: 5cm 5 cm Needle Gauge: 22 and 22 G    Additional Needles:  Procedures: ultrasound guided and nerve stimulator Supraclavicular block  Nerve Stimulator or Paresthesia:  Response: bicep contraction, 0.45 mA,   Additional Responses:   Narrative:  Start time: 06/08/2011 10:21 AM End time: 06/08/2011 10:31 AM Injection made incrementally with aspirations every 5 mL.  Performed by: Personally  Anesthesiologist: J. Adonis Huguenin, MD  Additional Notes: Functioning IV was confirmed and monitors applied.  A 50mm 22ga echogenic arrow stimulator was used. Sterile prep and drape,hand hygiene and sterile gloves were used.Ultrasound guidance: relevant anatomy identified, needle position confirmed, local anesthetic spread visualized around nerve(s)., vascular puncture avoided.  Image printed for medical record.  Negative aspiration and negative test dose prior to incremental administration of local anesthetic. The patient tolerated the procedure well.  Interscalene brachial plexus block Procedure Name: LMA Insertion Date/Time: 06/08/2011 10:56 AM Performed by: Kamariah Fruchter D Pre-anesthesia Checklist: Patient identified, Emergency Drugs available, Suction available and Patient being monitored Patient Re-evaluated:Patient Re-evaluated prior to inductionOxygen Delivery Method: Circle System Utilized Preoxygenation: Pre-oxygenation with 100% oxygen Intubation Type: IV induction Ventilation: Mask ventilation without difficulty LMA: LMA inserted LMA  Size: 4.0 Number of attempts: 1 Placement Confirmation: positive ETCO2 Tube secured with: Tape Dental Injury: Teeth and Oropharynx as per pre-operative assessment

## 2011-06-08 NOTE — Op Note (Signed)
Dictation [phone]

## 2011-06-09 ENCOUNTER — Encounter (HOSPITAL_BASED_OUTPATIENT_CLINIC_OR_DEPARTMENT_OTHER): Payer: Self-pay | Admitting: Orthopedic Surgery

## 2011-06-10 NOTE — Op Note (Signed)
NAMEADEL, NEYER               ACCOUNT NO.:  [id number]  MEDICAL RECORD NO.:  [id number]  LOCATION:                                 FACILITY:  PHYSICIAN:  Betha Loa, MD        DATE OF BIRTH:  01-Jul-1955  DATE OF PROCEDURE:  06/08/2011 DATE OF DISCHARGE:                              OPERATIVE REPORT   PREOPERATIVE DIAGNOSIS:  Left flexor pollicis longus tendon rupture and retained hardware.  POSTOPERATIVE DIAGNOSIS:  Left flexor pollicis longus tendon rupture and retained hardware.  PROCEDURE:   1. Removal of hardware left distal radius 2. Carpal tunnel release 3. Reconstruction FPL with palmaris longus graft.  SURGEON:  Betha Loa, MD  ASSISTANT:  Cindee Salt, MD  ANESTHESIA:  General with regional.  IV FLUIDS:  Per anesthesia flow sheet.  ESTIMATED BLOOD LOSS:  Minimal.  COMPLICATIONS:  None.  SPECIMENS:  None.  TOURNIQUET TIME:  72 minutes.  DISPOSITION:  Stable to PACU.  INDICATIONS:  Ms. Koslowski is a 56 year old female who had open reduction and internal fixation of a left distal radius fracture in 2006.  She did well after that.  She did not have any problems until recently when she suddenly became unable to flex the IP joint of her left thumb.  She was referred to me for further care.  On evaluation, she had intact sensation, capillary refill on the thumb.  She had no ability to actively flex the IP joint of the thumb.  There was no tenodesis.  I discussed with Ms. Mceuen the nature of the condition.  I recommended removal of the plate and reconstruction of the FPL tendon.  We also discussed carpal tunnel release would potentially be necessary due to the location of the tendon repair.  Risks, benefits, and alternatives of surgery were discussed including the risk of blood loss; infection; damage to nerves, vessels, tendons, ligaments, bone; failure of surgery; need for additional surgery; complications with wound healing; continued pain; and  stiffness.  She voiced understanding of these risks and elected to proceed.  OPERATIVE COURSE:  After being identified preoperatively by myself, the patient and I agreed upon the procedure and site of procedure.  The surgical site was marked.  The risks, benefits, and alternatives of the surgery were reviewed and she wished to proceed.  The surgical consent had been signed.  She was given 1 g of IV Ancef as preoperative antibiotic prophylaxis.  A regional block was performed by Anesthesia in preoperative holding.  She was transported to the operating room and placed on the operating room table in supine position with left upper extremity on an armboard.  General anesthesia was induced by anesthesiologist.  Left upper extremity was prepped and draped in normal sterile orthopedic fashion.  Surgical pause was performed between the surgeons, anesthesia, and operating room staff and all were in agreement as to the patient, procedure, site of procedure.  Previous incision for the distal radius fixation was followed.  This was carried into the subcutaneous tissues by spreading technique.  The superficial and deep portions of the FCR tendon sheath were scarred and this was released. The FCR was retracted ulnarly.  The FPL  was identified.  It was ruptured.  There was soft tissue covering over the plate including portions of the pronator quadratus muscle.  This was released.  The plate was cleared of soft tissue coverage.  All screws were removed and the plate was removed successfully.  Rongeurs were used as well as an osteotome to chip off the portions of bone that had grown up on the edges of the plate.  The soft tissue was repaired back over top of the plate removal site.  Attention was turned to the FPL tendon.  The end of the proximal portion of the tendon was freshened.  The distal portion was not able to be retrieved from the proximal wound.  Incision was made in the palm of the hand over  the carpal tunnel.  This was carried into the subcutaneous tissues again by spreading technique.  The transverse carpal ligament was identified and was incised in its full extent.  The FPL tendon was identified.  The median nerve was identified and was protected.  The FPL tendon had balled up and again was freshened.  The palmaris longus was identified and was taken as a graft and was taken distally through the wrist incision and proximally through a small transverse incision.  The palmaris longus tendon graft was then weaved through the distal portion of the FPL tendon in a Pulvertaft technique. A 3-0 FiberWire suture was used in a horizontal mattress fashion to secure the tendons together.  It was then passed through the carpal tunnel and again the Pulvertaft weave used in the proximal portion of the FPL tendon.  The tension was set.  So that with the wrist extended, the tip of the thumb contacted the long finger and when the wrist was flexed, the thumb came out to full extension.  All wounds were copiously irrigated with sterile saline.  A single inverted interrupted 4-0 Vicryl suture was placed in the wrist subcutaneous tissues.  The skin was closed with 4-0 nylon in a horizontal mattress fashion.  The wounds were dressed with sterile Xeroform, 4x4s, and wrapped with a Kerlix bandage. A volar splint at the wrist and a dorsal blocking splint at the thumb were placed and wrapped with Kerlix and Ace bandage.  This was placed with the wrist slightly flexed and the thumb in a flexed position.  This was wrapped with Kerlix and Ace bandage.  The tourniquet was deflated at 72 minutes.  The fingertips were pink with brisk capillary refill after deflation of the tourniquet.  Operative drapes were broken down.  The patient was awakened from Anesthesia safely.  She was transferred back to the stretcher and taken to PACU in stable condition.  I will see her back in the office in 1 week for  postoperative followup.  I will give her Percocet 5/325 1-2 p.o. q.6 hours p.r.n. pain, dispensed #40.     Betha Loa, MD     KK/MEDQ  D:  06/08/2011  T:  06/08/2011  Job:  [id number]

## 2011-06-14 ENCOUNTER — Encounter (HOSPITAL_BASED_OUTPATIENT_CLINIC_OR_DEPARTMENT_OTHER): Payer: Self-pay

## 2011-09-25 ENCOUNTER — Emergency Department (HOSPITAL_COMMUNITY): Payer: BC Managed Care – PPO

## 2011-09-25 ENCOUNTER — Emergency Department (HOSPITAL_COMMUNITY)
Admission: EM | Admit: 2011-09-25 | Discharge: 2011-09-25 | Disposition: A | Discharge status: Home / Self Care | Payer: BC Managed Care – PPO | Attending: Emergency Medicine | Admitting: Emergency Medicine

## 2011-09-25 ENCOUNTER — Encounter (HOSPITAL_COMMUNITY): Payer: Self-pay | Admitting: *Deleted

## 2011-09-25 DIAGNOSIS — M25529 Pain in unspecified elbow: Secondary | ICD-10-CM | POA: Insufficient documentation

## 2011-09-25 DIAGNOSIS — W010XXA Fall on same level from slipping, tripping and stumbling without subsequent striking against object, initial encounter: Secondary | ICD-10-CM | POA: Insufficient documentation

## 2011-09-25 DIAGNOSIS — I10 Essential (primary) hypertension: Secondary | ICD-10-CM | POA: Insufficient documentation

## 2011-09-25 DIAGNOSIS — S52599A Other fractures of lower end of unspecified radius, initial encounter for closed fracture: Secondary | ICD-10-CM | POA: Insufficient documentation

## 2011-09-25 DIAGNOSIS — M25539 Pain in unspecified wrist: Secondary | ICD-10-CM | POA: Insufficient documentation

## 2011-09-25 DIAGNOSIS — S52502A Unspecified fracture of the lower end of left radius, initial encounter for closed fracture: Secondary | ICD-10-CM

## 2011-09-25 MED ORDER — OXYCODONE-ACETAMINOPHEN 5-325 MG PO TABS: 1.0000 | ORAL_TABLET | ORAL | Status: AC | PRN

## 2011-09-25 MED ORDER — IBUPROFEN 800 MG PO TABS
800.0000 mg | ORAL_TABLET | Freq: Once | ORAL | Status: AC
  Administered 2011-09-25: 800 mg via ORAL
  Filled 2011-09-25: qty 1

## 2011-09-25 MED ORDER — IBUPROFEN 800 MG PO TABS: 800.0000 mg | ORAL_TABLET | Freq: Three times a day (TID) | ORAL | Status: AC

## 2011-09-25 MED ORDER — OXYCODONE-ACETAMINOPHEN 5-325 MG PO TABS
1.0000 | ORAL_TABLET | Freq: Once | ORAL | Status: AC
  Administered 2011-09-25: 1 via ORAL
  Filled 2011-09-25: qty 1

## 2011-09-25 NOTE — Progress Notes (Signed)
Orthopedic Tech Progress Note Patient Details:  Vanessa Patel [birth year] [id number]  Ortho Devices Type of Ortho Device: Arm foam sling;Sugartong splint Ortho Device/Splint Location: left arm Ortho Device/Splint Interventions: Application   [name] 09/25/2011, 5:14 PM

## 2011-09-25 NOTE — ED Notes (Signed)
Pt fell today, has fracture to left wrist, hx of surgery to that wrist. Pt very pale, diaphoretic and near syncope at triage.

## 2011-09-25 NOTE — ED Provider Notes (Signed)
History     CSN: [id number]  Arrival date & time 09/25/11  1514   First MD Initiated Contact with Patient 09/25/11 1529      Chief Complaint  Patient presents with  . Wrist Pain    HPI:  56 year old woman with a history of 2 previous surgeries on the left wrist (radial fx and FPL rupture) presenting with fall on out-stretched hand on the left.  Patient was playing outside with her dogs and slipped on wet pavement and fell on her left hand.  Patient complains of pain on dorsal left wrist and proximal left arm.  No pain in elbow or any where else.  Past Medical History  Diagnosis Date  . Environmental allergies   . Hypertension   . GERD (gastroesophageal reflux disease)   . Seasonal allergies   . Depression   . Arthritis     Past Surgical History  Procedure Date  . Left wrist     2007 left wrist plate fracture  . Appendectomy   . Colonoscopy   . Tendon repair 06/08/2011    Procedure: TENDON REPAIR;  Surgeon: Tami Ribas, MD;  Location: [facility];  Service: Orthopedics;  Laterality: Left;  LEFT FPL RECONSTRUCTION   . Hardware removal 06/08/2011    Procedure: HARDWARE REMOVAL;  Surgeon: Tami Ribas, MD;  Location: [facility];  Service: Orthopedics;  Laterality: Left;  FROM LEFT WRIST    Family History  Problem Relation Age of Onset  . Colon cancer Neg Hx     History  Substance Use Topics  . Smoking status: Former Smoker    Quit date: 11/09/1990  . Smokeless tobacco: Never Used  . Alcohol Use: 1.5 oz/week    3 drink(s) per week    OB History    Grav Para Term Preterm Abortions TAB SAB Ect Mult Living                  Review of Systems  All other systems reviewed and are negative.    Allergies  Review of patient's allergies indicates no known allergies.  Home Medications   Current Outpatient Rx  Name Route Sig Dispense Refill  . CALCIUM CARBONATE-VITAMIN D 500-125 MG-UNIT PO TABS Oral Take by mouth 2 (two) times daily.       Marland Kitchen FLUOXETINE HCL 20 MG PO CAPS      . FOLIC ACID 1 MG PO TABS      . GLUCOSAMINE 1500 COMPLEX PO Oral Take 1,500 mg by mouth.      Marland Kitchen HYDROCHLOROTHIAZIDE 25 MG PO TABS      . LISINOPRIL 10 MG PO TABS Oral Take 10 mg by mouth daily.    Marland Kitchen LORATADINE 10 MG PO TABS Oral Take 10 mg by mouth daily.      Marland Kitchen OMEPRAZOLE 20 MG PO CPDR Oral Take 20 mg by mouth daily.      Marland Kitchen PROGESTERONE MICRONIZED 100 MG PO CAPS Oral Take 100 mg by mouth daily.        BP 109/56  Pulse 64  Temp 98 F (36.7 C) (Oral)  Resp 20  SpO2 99%  Physical Exam  Constitutional: She appears well-developed and well-nourished. She does not appear ill. She appears distressed.  Cardiovascular:  Pulses:      Radial pulses are 2+ on the right side, and 2+ on the left side.  Musculoskeletal:       Left shoulder: Normal.  Left elbow: Normal.       Left wrist: She exhibits decreased range of motion, tenderness, effusion and deformity.       Right knee: Normal.       Left knee: She exhibits laceration (small abrasion over patella). She exhibits normal range of motion, no swelling, no effusion, no ecchymosis, no deformity and no erythema. no tenderness found.       Left forearm: She exhibits tenderness. She exhibits no swelling, no edema, no deformity and no laceration.  Neurological:       Sensation in left hand is intact.  Skin:       Left hand is blue but has been sitting directly on ice.  Capillary refill is < 2 seconds on the left.    ED Course  Procedures (including critical care time)  5:14 PM - Pt reassessed.  Splint in place.  Capillary refill < 2 seconds in her left fingers.  Sensation intact.  Pt instructed on signs of neurovascular compromise.  Labs Reviewed - No data to display Dg Elbow Complete Left  09/25/2011  *RADIOLOGY REPORT*  Clinical Data: Fall, pain.  LEFT ELBOW - COMPLETE 3+ VIEW  Comparison: None.  Findings: Imaged bones, joints and soft tissues appear normal.  IMPRESSION: Negative exam.  Original  Report Authenticated By: Bernadene Bell. D'ALESSIO, M.D.   Dg Wrist Complete Left  09/25/2011  *RADIOLOGY REPORT*  Clinical Data: Status post fall.  Pain.  LEFT WRIST - COMPLETE 3+ VIEW  Comparison: Plain films 10/09/2004.  Findings: The patient has an acute fracture of the distal radius with dorsal angulation and intra-articular extension.  Tracks from prior plate and screw fixation are noted.  No other fracture is identified.  Soft tissue swelling is noted.  IMPRESSION:   Acute dorsally angulated fracture of the distal radius with intra-articular extension.  Associated soft tissue swelling noted.  Original Report Authenticated By: Bernadene Bell. D'ALESSIO, M.D.     1. Distal radius fracture, left       MDM  1.   Distal radial fracture:  Neurovascularly intact.  Spoke with Dr. Teressa Senter (Kuzma's partner).  As long as patient is neurovascularly intact, he recommends splinting and follow-up with their practice on Monday.  He feels reduction in a neurovascularly intact patient carries only risk and no benefit.  Patient placed in sugar tong splint and discharged with instructions to follow up with Dr. Merlyn Lot on Monday.  Patient will return to the ED if signs of neurovascular compromise present.  Lollie Sails, MD 09/25/11 [phone]

## 2011-09-25 NOTE — ED Provider Notes (Signed)
I saw and evaluated the patient, reviewed the resident's note and I agree with the findings and plan.   .Face to face Exam:  General:  Awake HEENT:  Atraumatic Resp:  Normal effort Abd:  Nondistended Neuro:No focal weakness Lymph: No adenopathy   Nelia Shi, MD 09/25/11 1727

## 2011-09-28 ENCOUNTER — Other Ambulatory Visit: Payer: Self-pay | Admitting: Orthopedic Surgery

## 2011-09-28 NOTE — Progress Notes (Signed)
Pt fx lt wrist-was here 4/13 for repair lt wrist and ctr ekg 4/13-will need [employer]

## 2011-09-30 ENCOUNTER — Encounter (HOSPITAL_BASED_OUTPATIENT_CLINIC_OR_DEPARTMENT_OTHER)
Admission: RE | Disposition: A | Discharge status: Home / Self Care | Payer: Self-pay | Source: Ambulatory Visit | Attending: Orthopedic Surgery

## 2011-09-30 ENCOUNTER — Encounter (HOSPITAL_BASED_OUTPATIENT_CLINIC_OR_DEPARTMENT_OTHER): Payer: Self-pay | Admitting: *Deleted

## 2011-09-30 ENCOUNTER — Ambulatory Visit (HOSPITAL_BASED_OUTPATIENT_CLINIC_OR_DEPARTMENT_OTHER)
Admission: RE | Admit: 2011-09-30 | Discharge: 2011-09-30 | Disposition: A | Discharge status: Home / Self Care | Payer: BC Managed Care – PPO | Source: Ambulatory Visit | Attending: Orthopedic Surgery | Admitting: Orthopedic Surgery

## 2011-09-30 ENCOUNTER — Ambulatory Visit (HOSPITAL_BASED_OUTPATIENT_CLINIC_OR_DEPARTMENT_OTHER): Payer: BC Managed Care – PPO | Admitting: *Deleted

## 2011-09-30 DIAGNOSIS — S52599A Other fractures of lower end of unspecified radius, initial encounter for closed fracture: Secondary | ICD-10-CM | POA: Insufficient documentation

## 2011-09-30 DIAGNOSIS — I10 Essential (primary) hypertension: Secondary | ICD-10-CM | POA: Insufficient documentation

## 2011-09-30 DIAGNOSIS — F329 Major depressive disorder, single episode, unspecified: Secondary | ICD-10-CM | POA: Insufficient documentation

## 2011-09-30 DIAGNOSIS — F3289 Other specified depressive episodes: Secondary | ICD-10-CM | POA: Insufficient documentation

## 2011-09-30 DIAGNOSIS — K219 Gastro-esophageal reflux disease without esophagitis: Secondary | ICD-10-CM | POA: Insufficient documentation

## 2011-09-30 DIAGNOSIS — W19XXXA Unspecified fall, initial encounter: Secondary | ICD-10-CM | POA: Insufficient documentation

## 2011-09-30 LAB — POCT I-STAT, CHEM 8
BUN: 14 mg/dL (ref 6–23)
Calcium, Ion: 1.1 mmol/L — ABNORMAL LOW (ref 1.12–1.23)
Chloride: 89 mEq/L — ABNORMAL LOW (ref 96–112)
Glucose, Bld: 83 mg/dL (ref 70–99)
HCT: 39 % (ref 36.0–46.0)
Potassium: 3.2 mEq/L — ABNORMAL LOW (ref 3.5–5.1)

## 2011-09-30 SURGERY — OPEN REDUCTION INTERNAL FIXATION (ORIF) DISTAL RADIUS FRACTURE
Anesthesia: General | Site: Wrist | Laterality: Left | Wound class: Clean

## 2011-09-30 MED ORDER — HYDROMORPHONE HCL PF 1 MG/ML IJ SOLN: 0.2500 mg | INTRAMUSCULAR | Status: DC | PRN

## 2011-09-30 MED ORDER — LIDOCAINE HCL (CARDIAC) 20 MG/ML IV SOLN
INTRAVENOUS | Status: DC | PRN
  Administered 2011-09-30: 40 mg via INTRAVENOUS

## 2011-09-30 MED ORDER — DROPERIDOL 2.5 MG/ML IJ SOLN: 0.6250 mg | INTRAMUSCULAR | Status: DC | PRN

## 2011-09-30 MED ORDER — LIDOCAINE HCL (CARDIAC) 20 MG/ML IV SOLN: INTRAVENOUS | Status: DC | PRN

## 2011-09-30 MED ORDER — DEXAMETHASONE SODIUM PHOSPHATE 10 MG/ML IJ SOLN
INTRAMUSCULAR | Status: DC | PRN
  Administered 2011-09-30: 10 mg via INTRAVENOUS

## 2011-09-30 MED ORDER — OXYCODONE HCL 5 MG PO TABS: 5.0000 mg | ORAL_TABLET | Freq: Once | ORAL | Status: DC | PRN

## 2011-09-30 MED ORDER — FENTANYL CITRATE 0.05 MG/ML IJ SOLN
50.0000 ug | INTRAMUSCULAR | Status: DC | PRN
  Administered 2011-09-30: 100 ug via INTRAVENOUS

## 2011-09-30 MED ORDER — MIDAZOLAM HCL 2 MG/2ML IJ SOLN
1.0000 mg | INTRAMUSCULAR | Status: DC | PRN
  Administered 2011-09-30: 2 mg via INTRAVENOUS

## 2011-09-30 MED ORDER — MIDAZOLAM HCL 5 MG/5ML IJ SOLN
INTRAMUSCULAR | Status: DC | PRN
  Administered 2011-09-30: 1 mg via INTRAVENOUS

## 2011-09-30 MED ORDER — LACTATED RINGERS IV SOLN
INTRAVENOUS | Status: DC
Start: 2011-09-30 — End: 2011-09-30
  Administered 2011-09-30 (×2): via INTRAVENOUS

## 2011-09-30 MED ORDER — EPHEDRINE SULFATE 50 MG/ML IJ SOLN
INTRAMUSCULAR | Status: DC | PRN
  Administered 2011-09-30: 10 mg via INTRAVENOUS

## 2011-09-30 MED ORDER — BUPIVACAINE-EPINEPHRINE PF 0.5-1:200000 % IJ SOLN
INTRAMUSCULAR | Status: DC | PRN
  Administered 2011-09-30: 150 mg

## 2011-09-30 MED ORDER — CEFAZOLIN SODIUM-DEXTROSE 2-3 GM-% IV SOLR
2.0000 g | Freq: Once | INTRAVENOUS | Status: AC
  Administered 2011-09-30: 2 g via INTRAVENOUS

## 2011-09-30 MED ORDER — PROPOFOL 10 MG/ML IV EMUL
INTRAVENOUS | Status: DC | PRN
  Administered 2011-09-30: 150 mg via INTRAVENOUS
  Administered 2011-09-30: 30 mg via INTRAVENOUS

## 2011-09-30 MED ORDER — ONDANSETRON HCL 4 MG/2ML IJ SOLN
INTRAMUSCULAR | Status: DC | PRN
  Administered 2011-09-30: 4 mg via INTRAVENOUS

## 2011-09-30 MED ORDER — OXYCODONE HCL 5 MG/5ML PO SOLN: 5.0000 mg | Freq: Once | ORAL | Status: DC | PRN

## 2011-09-30 SURGICAL SUPPLY — 70 items
BANDAGE CONFORM 3  STR LF (GAUZE/BANDAGES/DRESSINGS) IMPLANT
BANDAGE ELASTIC 3 VELCRO ST LF (GAUZE/BANDAGES/DRESSINGS) ×2 IMPLANT
BANDAGE GAUZE ELAST BULKY 4 IN (GAUZE/BANDAGES/DRESSINGS) ×2 IMPLANT
BIT DRILL 2.0 LNG QUCK RELEASE (BIT) ×1 IMPLANT
BIT DRILL 2.8X5 QR DISP (BIT) ×2 IMPLANT
BLADE MINI RND TIP GREEN BEAV (BLADE) IMPLANT
BLADE SURG 15 STRL LF DISP TIS (BLADE) ×2 IMPLANT
BLADE SURG 15 STRL SS (BLADE) ×4
BNDG CMPR 9X4 STRL LF SNTH (GAUZE/BANDAGES/DRESSINGS) ×1
BNDG CMPR MD 5X2 ELC HKLP STRL (GAUZE/BANDAGES/DRESSINGS) ×1
BNDG ELASTIC 2 VLCR STRL LF (GAUZE/BANDAGES/DRESSINGS) ×2 IMPLANT
BNDG ESMARK 4X9 LF (GAUZE/BANDAGES/DRESSINGS) ×2 IMPLANT
CHLORAPREP W/TINT 26ML (MISCELLANEOUS) ×2 IMPLANT
CLOTH BEACON ORANGE TIMEOUT ST (SAFETY) ×2 IMPLANT
CORDS BIPOLAR (ELECTRODE) ×2 IMPLANT
COVER MAYO STAND STRL (DRAPES) ×2 IMPLANT
COVER TABLE BACK 60X90 (DRAPES) ×2 IMPLANT
DRAPE EXTREMITY T 121X128X90 (DRAPE) ×2 IMPLANT
DRAPE OEC MINIVIEW 54X84 (DRAPES) ×2 IMPLANT
DRAPE SURG 17X23 STRL (DRAPES) ×2 IMPLANT
DRILL 2.0 LNG QUICK RELEASE (BIT) ×2
GAUZE XEROFORM 1X8 LF (GAUZE/BANDAGES/DRESSINGS) ×2 IMPLANT
GLOVE BIO SURGEON STRL SZ7.5 (GLOVE) ×2 IMPLANT
GLOVE BIOGEL PI IND STRL 8 (GLOVE) ×1 IMPLANT
GLOVE BIOGEL PI IND STRL 8.5 (GLOVE) ×1 IMPLANT
GLOVE BIOGEL PI INDICATOR 8 (GLOVE) ×1
GLOVE BIOGEL PI INDICATOR 8.5 (GLOVE) ×1
GLOVE SURG ORTHO 8.0 STRL STRW (GLOVE) ×2 IMPLANT
GOWN PREVENTION PLUS XLARGE (GOWN DISPOSABLE) ×2 IMPLANT
GOWN STRL REIN XL XLG (GOWN DISPOSABLE) ×4 IMPLANT
GUIDEWIRE ORTHO 0.054X6 (WIRE) ×2 IMPLANT
NEEDLE HYPO 22GX1.5 SAFETY (NEEDLE) IMPLANT
NEEDLE HYPO 25X1 1.5 SAFETY (NEEDLE) IMPLANT
NS IRRIG 1000ML POUR BTL (IV SOLUTION) ×2 IMPLANT
PACK BASIN DAY SURGERY FS (CUSTOM PROCEDURE TRAY) ×2 IMPLANT
PAD CAST 3X4 CTTN HI CHSV (CAST SUPPLIES) ×1 IMPLANT
PAD CAST 4YDX4 CTTN HI CHSV (CAST SUPPLIES) IMPLANT
PADDING CAST ABS 4INX4YD NS (CAST SUPPLIES) ×1
PADDING CAST ABS COTTON 4X4 ST (CAST SUPPLIES) ×1 IMPLANT
PADDING CAST COTTON 3X4 STRL (CAST SUPPLIES) ×2
PADDING CAST COTTON 4X4 STRL (CAST SUPPLIES)
PLATE ACULOC 2 VDR STD LT (Plate) ×2 IMPLANT
SCREW 3.5MMX10.0MM (Screw) ×2 IMPLANT
SCREW 3.5MMX12.0MM (Screw) ×2 IMPLANT
SCREW BN FT 16X2.3XLCK HEX CRT (Screw) ×1 IMPLANT
SCREW CORT 3.5X14 (Screw) ×2 IMPLANT
SCREW CORT FT 20X2.3XLCK HEX (Screw) ×1 IMPLANT
SCREW CORTICAL LOCKING 2.3X16M (Screw) ×1 IMPLANT
SCREW CORTICAL LOCKING 2.3X18M (Screw) ×4 IMPLANT
SCREW CORTICAL LOCKING 2.3X20M (Screw) ×4 IMPLANT
SCREW FX18X2.3XSMTH LCK NS CRT (Screw) ×4 IMPLANT
SCREW FX20X2.3XSMTH LCK NS CRT (Screw) ×1 IMPLANT
SCREW NON TOGG 2.3X20MM (Screw) ×2 IMPLANT
SLEEVE SCD COMPRESS KNEE MED (MISCELLANEOUS) ×2 IMPLANT
SPLINT PLASTER CAST XFAST 4X15 (CAST SUPPLIES) ×10 IMPLANT
SPLINT PLASTER XTRA FAST SET 4 (CAST SUPPLIES) ×10
SPONGE GAUZE 4X4 12PLY (GAUZE/BANDAGES/DRESSINGS) ×2 IMPLANT
STOCKINETTE 4X48 STRL (DRAPES) ×2 IMPLANT
SUCTION FRAZIER TIP 10 FR DISP (SUCTIONS) IMPLANT
SUT ETHILON 3 0 PS 1 (SUTURE) IMPLANT
SUT ETHILON 4 0 PS 2 18 (SUTURE) IMPLANT
SUT VIC AB 3-0 PS1 18 (SUTURE) ×1
SUT VIC AB 3-0 PS1 18XBRD (SUTURE) ×1 IMPLANT
SUT VICRYL 4-0 PS2 18IN ABS (SUTURE) ×2 IMPLANT
SYR BULB 3OZ (MISCELLANEOUS) ×2 IMPLANT
SYR CONTROL 10ML LL (SYRINGE) IMPLANT
TOWEL OR 17X24 6PK STRL BLUE (TOWEL DISPOSABLE) ×4 IMPLANT
TUBE CONNECTING 20X1/4 (TUBING) IMPLANT
UNDERPAD 30X30 INCONTINENT (UNDERPADS AND DIAPERS) ×2 IMPLANT
WATER STERILE IRR 1000ML POUR (IV SOLUTION) ×2 IMPLANT

## 2011-09-30 NOTE — Anesthesia Postprocedure Evaluation (Signed)
Anesthesia Post Note  Patient: Vanessa Patel  Procedure(s) Performed: Procedure(s) (LRB): OPEN REDUCTION INTERNAL FIXATION (ORIF) DISTAL RADIAL FRACTURE (Left)  Anesthesia type: general  Patient location: PACU  Post pain: Pain level controlled  Post assessment: Patient's Cardiovascular Status Stable  Last Vitals:  Filed Vitals:   09/30/11 1345  BP: 102/70  Pulse: 74  Temp:   Resp: 14    Post vital signs: Reviewed and stable  Level of consciousness: sedated  Complications: No apparent anesthesia complications

## 2011-09-30 NOTE — Anesthesia Procedure Notes (Addendum)
Anesthesia Regional Block:  Supraclavicular block  Pre-Anesthetic Checklist: ,, timeout performed, Correct Patient, Correct Site, Correct Laterality, Correct Procedure,, site marked, risks and benefits discussed, Surgical consent,  Pre-op evaluation,  At surgeon's request and post-op pain management  Laterality: Left  Prep: chloraprep       Needles:  Injection technique: Single-shot  Needle Type: Echogenic Stimulator Needle     Needle Length: 5cm 5 cm Needle Gauge: 22 and 22 G    Additional Needles:  Procedures: ultrasound guided and nerve stimulator Supraclavicular block  Nerve Stimulator or Paresthesia:  Response: bicep contraction, 0.45 mA,   Additional Responses:   Narrative:  Start time: 09/30/2011 10:37 AM End time: 09/30/2011 10:48 AM Injection made incrementally with aspirations every 5 mL.  Performed by: Personally  Anesthesiologist: J. Adonis Huguenin, MD  Additional Notes: Functioning IV was confirmed and monitors applied.  A 50mm 22ga echogenic arrow stimulator was used. Sterile prep and drape,hand hygiene and sterile gloves were used.Ultrasound guidance: relevant anatomy identified, needle position confirmed, local anesthetic spread visualized around nerve(s)., vascular puncture avoided.  Image printed for medical record.  Negative aspiration and negative test dose prior to incremental administration of local anesthetic. The patient tolerated the procedure well.  Interscalene brachial plexus block Procedure Name: LMA Insertion Date/Time: 09/30/2011 11:56 AM Performed by: Meyer Russel Pre-anesthesia Checklist: Patient identified, Emergency Drugs available, Suction available and Patient being monitored Patient Re-evaluated:Patient Re-evaluated prior to inductionOxygen Delivery Method: Circle System Utilized Preoxygenation: Pre-oxygenation with 100% oxygen Intubation Type: IV induction Ventilation: Mask ventilation without difficulty LMA: LMA inserted LMA  Size: 4.0 Number of attempts: 1 Airway Equipment and Method: bite block Placement Confirmation: positive ETCO2 and breath sounds checked- equal and bilateral Tube secured with: Tape Dental Injury: Teeth and Oropharynx as per pre-operative assessment

## 2011-09-30 NOTE — Transfer of Care (Signed)
Immediate Anesthesia Transfer of Care Note  Patient: Vanessa Patel  Procedure(s) Performed: Procedure(s) (LRB): OPEN REDUCTION INTERNAL FIXATION (ORIF) DISTAL RADIAL FRACTURE (Left)  Patient Location: PACU  Anesthesia Type: GA combined with regional for post-op pain  Level of Consciousness: awake, alert  and oriented  Airway & Oxygen Therapy: Patient Spontanous Breathing and Patient connected to face mask oxygen  Post-op Assessment: Report given to PACU RN, Post -op Vital signs reviewed and stable and Patient moving all extremities  Post vital signs: Reviewed and stable  Complications: No apparent anesthesia complications

## 2011-09-30 NOTE — Anesthesia Preprocedure Evaluation (Addendum)
Anesthesia Evaluation  Patient identified by MRN, date of birth, ID band Patient awake    Reviewed: Allergy & Precautions, H&P , NPO status , Patient's Chart, lab work & pertinent test results  History of Anesthesia Complications Negative for: history of anesthetic complications  Airway Mallampati: II      Dental  (+) Teeth Intact and Dental Advisory Given   Pulmonary neg pulmonary ROS,  breath sounds clear to auscultation        Cardiovascular hypertension, Pt. on medications Rhythm:Regular Rate:Normal     Neuro/Psych PSYCHIATRIC DISORDERS Depression negative neurological ROS     GI/Hepatic Neg liver ROS, GERD-  Medicated,  Endo/Other  negative endocrine ROS  Renal/GU negative Renal ROS     Musculoskeletal   Abdominal   Peds  Hematology   Anesthesia Other Findings   Reproductive/Obstetrics                           Anesthesia Physical Anesthesia Plan  ASA: II  Anesthesia Plan: General   Post-op Pain Management:    Induction: Intravenous  Airway Management Planned: LMA  Additional Equipment:   Intra-op Plan:   Post-operative Plan: Extubation in OR  Informed Consent: I have reviewed the patients History and Physical, chart, labs and discussed the procedure including the risks, benefits and alternatives for the proposed anesthesia with the patient or authorized representative who has indicated his/her understanding and acceptance.   Dental advisory given  Plan Discussed with: CRNA, Anesthesiologist and Surgeon  Anesthesia Plan Comments:         Anesthesia Quick Evaluation

## 2011-09-30 NOTE — H&P (Signed)
Vanessa Patel is an 56 y.o. female.   Chief Complaint: left distal radius fracture HPI: 56 yo female had fall 09/25/11 injuring left wrist.  Seen at [facility] where XR revealed left distal radius fracture.  Had previous fracture treated with ORIF then required removal of hardware and repair of tendon rupture.  Reports no other injuries at this time.  Past Medical History  Diagnosis Date  . Environmental allergies   . Hypertension   . GERD (gastroesophageal reflux disease)   . Seasonal allergies   . Depression   . Arthritis     Past Surgical History  Procedure Date  . Left wrist     2007 left wrist plate fracture  . Appendectomy   . Colonoscopy   . Tendon repair 06/08/2011    Procedure: TENDON REPAIR;  Surgeon: Tami Ribas, MD;  Location: [facility];  Service: Orthopedics;  Laterality: Left;  LEFT FPL RECONSTRUCTION   . Hardware removal 06/08/2011    Procedure: HARDWARE REMOVAL;  Surgeon: Tami Ribas, MD;  Location: [facility];  Service: Orthopedics;  Laterality: Left;  FROM LEFT WRIST    Family History  Problem Relation Age of Onset  . Colon cancer Neg Hx    Social History:  reports that she quit smoking about 20 years ago. She has never used smokeless tobacco. She reports that she drinks about 1.5 ounces of alcohol per week. She reports that she does not use illicit drugs.  Allergies: No Known Allergies  Medications Prior to Admission  Medication Sig Dispense Refill  . Calcium Carbonate-Vitamin D (CALCIUM 500 + D) 500-125 MG-UNIT TABS Take by mouth 2 (two) times daily.        . cetirizine (ZYRTEC) 10 MG tablet Take 10 mg by mouth daily.      Marland Kitchen FLUoxetine (PROZAC) 20 MG capsule       . hydrochlorothiazide (HYDRODIURIL) 25 MG tablet       . ibuprofen (ADVIL,MOTRIN) 800 MG tablet Take 1 tablet (800 mg total) by mouth every 8 (eight) hours.  30 tablet  0  . lisinopril (PRINIVIL,ZESTRIL) 10 MG tablet Take 10 mg by mouth daily.      Marland Kitchen omeprazole  (PRILOSEC) 20 MG capsule Take 20 mg by mouth daily.        Marland Kitchen oxyCODONE-acetaminophen (ROXICET) 5-325 MG per tablet Take 1 tablet by mouth every 4 (four) hours as needed (for breakthrough pain).  12 tablet  0    Results for orders placed during the hospital encounter of 09/30/11 (from the past 48 hour(s))  POCT I-STAT, CHEM 8     Status: Abnormal   Collection Time   09/30/11  9:40 AM      Component Value Range Comment   Sodium 126 (*) 135 - 145 mEq/L    Potassium 3.2 (*) 3.5 - 5.1 mEq/L    Chloride 89 (*) 96 - 112 mEq/L    BUN 14  6 - 23 mg/dL    Creatinine, Ser 4.09  0.50 - 1.10 mg/dL    Glucose, Bld 83  70 - 99 mg/dL    Calcium, Ion 8.11 (*) 1.12 - 1.23 mmol/L    TCO2 27  0 - 100 mmol/L    Hemoglobin 13.3  12.0 - 15.0 g/dL    HCT 91.4  78.2 - 95.6 %     No results found.   A comprehensive review of systems was negative except for: Eyes: positive for contacts/glasses  Blood pressure 90/57, pulse 74,  temperature 98 F (36.7 C), temperature source Oral, resp. rate 12, height 5\' 6"  (1.676 m), weight 61.689 kg (136 lb), SpO2 100.00%.  General appearance: alert, cooperative and appears stated age Head: Normocephalic, without obvious abnormality, atraumatic Neck: supple, symmetrical, trachea midline Resp: clear to auscultation bilaterally Cardio: regular rate and rhythm GI: soft, non-tender; bowel sounds normal; no masses,  no organomegaly Extremities: light touch sensation and capillary refill intact all digits.  +epl/fpl/io. ttp distal radius.  no wounds Pulses: 2+ and symmetric Skin: Skin color, texture, turgor normal. No rashes or lesions Neurologic: Grossly normal Incision/Wound: na  Assessment/Plan Left distal radius fracture.  Discussed non operative and operative treatment options.  She wishes to proceed with operative fixation.  Risks, benefits, and alternatives of surgery were discussed and the patient agrees with the plan of care.   Eugenie Harewood R 09/30/2011, 10:53  AM

## 2011-09-30 NOTE — Op Note (Signed)
Dictation [phone]

## 2011-09-30 NOTE — Progress Notes (Signed)
Assisted Dr. Singer with left, ultrasound guided, supraclavicular block. Side rails up, monitors on throughout procedure. See vital signs in flow sheet. Tolerated Procedure well. 

## 2011-10-01 NOTE — Op Note (Signed)
Vanessa Patel               ACCOUNT NO.:  [id number]  MEDICAL RECORD NO.:  [id number]  LOCATION:                                 FACILITY:  PHYSICIAN:  Betha Loa, MD        DATE OF BIRTH:  1955-04-04  DATE OF PROCEDURE:  09/30/2011 DATE OF DISCHARGE:                              OPERATIVE REPORT   PREOPERATIVE DIAGNOSIS:  Left comminuted intra-articular distal radius fracture.  POSTOPERATIVE DIAGNOSIS:  Left comminuted intra-articular distal radius fracture.  PROCEDURE:  Open reduction and internal fixation left comminuted intra- articular distal radius fracture.  SURGEON:  Betha Loa, MD  ASSISTANT:  Cindee Salt, M.D.  ANESTHESIA:  General with regional.  IV FLUIDS:  Per anesthesia flow sheet.  ESTIMATED BLOOD LOSS:  Minimal.  COMPLICATIONS:  None.  SPECIMENS:  None.  TOURNIQUET TIME:  48 minutes.  DISPOSITION:  Stable to PACU.  INDICATIONS:  Vanessa Patel is a 56 year old female who is a previous Vanessa Patel of mine.  She had a previous left distal radius fracture that was complicated by an FPL rupture.  She had removal of her plate and reconstruction of the FPL tendon.  She fell over the weekend injuring her left wrist again.  She was seen at the emergency department where radiographs were taken revealing a comminuted intra-articular distal radius fracture.  She followed up with me in the office.  Her radiographs showed comminuted intra-articular distal radius fracture with dorsal angulation.  I discussed with Vanessa Patel the nature of the injury.  We discussed non-operative and operative treatment options. She wished to proceed with operative fixation.  Risks, benefits, and alternatives of surgery were discussed including risk of blood loss, infection, damage to nerves, vessels, tendons, ligaments, bone, failure of surgery, need for additional surgery, complications with wound healing; continued pain, nonunion, malunion, stiffness.  She  voiced understanding of these risks and elected to proceed.  OPERATIVE COURSE:  After being identified preoperatively by myself, the Vanessa Patel and I agreed upon procedure and site of procedure.  The surgical site was marked.  The risks, benefits, and alternatives of surgery were reviewed and she wished to proceed.  Surgical consent had been signed. She was given 2 g of IV Ancef as preoperative antibiotic prophylaxis. She was transported to the operating room and placed on the operating room table in a supine position with the left upper extremity on arm board.  General anesthesia was induced by the anesthesiologist. Regional block had been performed by anesthesia in preoperative holding room.  Left upper extremity was prepped and draped in normal sterile orthopedic fashion.  Surgical pause was performed between surgeons, anesthesia, operating staff, and all were in agreement as to the Vanessa Patel, procedure, and site of procedure.  Tourniquet at the proximal aspect of the extremity was inflated 250 mmHg after exsanguination of the limb with an Esmarch bandage.  The previous incision was followed and carried into subcutaneous tissues by spreading technique.  The superficial and deep portions of the FCR tendon sheath, which had reformed were incised sharply.  The FCR and FPL were retracted ulnarly to protect the palmar cutaneous branch of median nerve.  The FPL reconstruction tendon weave was  identified and was intact.  The scar on top of the radius was released on radial side of the radius and elevated with a periosteal elevator.  The fracture site was easily identified. It was cleared of any soft tissue interposition.  The periosteum on the dorsal aspect of the radius was freed through the fracture site.  The C- arm was used in AP and lateral projections to ensure ability to reduce the fracture, which was the case.  A locking plate from the [employer] volar distal radial locking set was selected  and secured to the bone using the guide pins.  These were placed at the distal holes.  The shaft of the plate was then reduced down to the bone.  The C-arm was again used in AP, lateral, and oblique projections to ensure appropriate reduction and position of hardware, which was the case.  The volar tilt was able to be restored.  A single screw was placed in the slotted hole in the shaft of the plate.  The distal holes in the plate were then filled using standard AO drilling and measuring technique.  A nonlocking screw was placed in the ulnar side first to bring the bone up to the plate.  The remaining distal holes were then filled with locking pegs.  The 2 radial styloid holes were filled with locking screws.  The remaining hole was filled with locking PEG and then the nonlocking screw was exchanged for locking peg.  The remaining 2 holes in the shaft of the plate were filled with nonlocking screws.  Good purchase was obtained.  The C-arm was used in AP, lateral, and oblique projections to ensure appropriate reduction and position of the hardware, which was the case.  The wound was copiously irrigated with sterile saline.  Soft tissue was repaired back over top of the plate using 4-0 Vicryl suture.  A single inverted interrupted Vicryl suture was placed in the subcutaneous tissues and the skin was closed with 4-0 nylon in a horizontal mattress fashion.  The wound was dressed with sterile Xeroform, 4 x 4s, and wrapped with a Kerlix bandage.  Volar splint was placed and wrapped with Kerlix Ace bandage.  The tourniquet was deflated at 48 minutes.  The fingertips were pink with brisk capillary refill after deflation of tourniquet. Prior to placing the dressing, the forearm was placed through full pronation, supination, range of motion, was stable at the distal radioulnar joint throughout.  The operative drapes were broken down and the Vanessa Patel was awoken from anesthesia safely.  She was  transferred back to stretcher and taken to PACU in stable condition.  I will see her back in the office in 1 week for postoperative followup.  She was written a prescription for pain medication at her office visit few days ago.  I will see her back in the office in 1 week.     Betha Loa, MD     KK/MEDQ  D:  09/30/2011  T:  10/01/2011  Job:  [id number]

## 2013-05-10 ENCOUNTER — Encounter (HOSPITAL_BASED_OUTPATIENT_CLINIC_OR_DEPARTMENT_OTHER)
Admission: RE | Admit: 2013-05-10 | Discharge: 2013-05-10 | Disposition: A | Discharge status: Home / Self Care | Payer: BC Managed Care – PPO | Source: Ambulatory Visit | Attending: Orthopedic Surgery | Admitting: Orthopedic Surgery

## 2013-05-10 ENCOUNTER — Encounter (HOSPITAL_BASED_OUTPATIENT_CLINIC_OR_DEPARTMENT_OTHER): Payer: Self-pay | Admitting: *Deleted

## 2013-05-10 ENCOUNTER — Other Ambulatory Visit: Payer: Self-pay | Admitting: Orthopedic Surgery

## 2013-05-10 DIAGNOSIS — Z01810 Encounter for preprocedural cardiovascular examination: Secondary | ICD-10-CM | POA: Insufficient documentation

## 2013-05-10 DIAGNOSIS — Z01812 Encounter for preprocedural laboratory examination: Secondary | ICD-10-CM | POA: Insufficient documentation

## 2013-05-10 LAB — BASIC METABOLIC PANEL
BUN: 11 mg/dL (ref 6–23)
CALCIUM: 9.3 mg/dL (ref 8.4–10.5)
CO2: 27 mEq/L (ref 19–32)
CREATININE: 0.65 mg/dL (ref 0.50–1.10)
Chloride: 93 mEq/L — ABNORMAL LOW (ref 96–112)
Glucose, Bld: 79 mg/dL (ref 70–99)
Potassium: 4 mEq/L (ref 3.7–5.3)
Sodium: 134 mEq/L — ABNORMAL LOW (ref 137–147)

## 2013-05-10 NOTE — Progress Notes (Signed)
Has ben her for fx wrist-will come in for bmet-ekg

## 2013-05-14 ENCOUNTER — Encounter (HOSPITAL_BASED_OUTPATIENT_CLINIC_OR_DEPARTMENT_OTHER): Payer: Self-pay | Admitting: *Deleted

## 2013-05-14 ENCOUNTER — Encounter (HOSPITAL_BASED_OUTPATIENT_CLINIC_OR_DEPARTMENT_OTHER)
Admission: RE | Disposition: A | Discharge status: Home / Self Care | Payer: Self-pay | Source: Ambulatory Visit | Attending: Orthopedic Surgery

## 2013-05-14 ENCOUNTER — Ambulatory Visit (HOSPITAL_BASED_OUTPATIENT_CLINIC_OR_DEPARTMENT_OTHER)
Admission: RE | Admit: 2013-05-14 | Discharge: 2013-05-14 | Disposition: A | Discharge status: Home / Self Care | Payer: BC Managed Care – PPO | Source: Ambulatory Visit | Attending: Orthopedic Surgery | Admitting: Orthopedic Surgery

## 2013-05-14 ENCOUNTER — Ambulatory Visit (HOSPITAL_BASED_OUTPATIENT_CLINIC_OR_DEPARTMENT_OTHER): Payer: BC Managed Care – PPO | Admitting: Anesthesiology

## 2013-05-14 ENCOUNTER — Encounter (HOSPITAL_BASED_OUTPATIENT_CLINIC_OR_DEPARTMENT_OTHER): Payer: BC Managed Care – PPO | Admitting: Anesthesiology

## 2013-05-14 DIAGNOSIS — F3289 Other specified depressive episodes: Secondary | ICD-10-CM | POA: Insufficient documentation

## 2013-05-14 DIAGNOSIS — M6740 Ganglion, unspecified site: Secondary | ICD-10-CM | POA: Insufficient documentation

## 2013-05-14 DIAGNOSIS — F329 Major depressive disorder, single episode, unspecified: Secondary | ICD-10-CM | POA: Insufficient documentation

## 2013-05-14 DIAGNOSIS — J301 Allergic rhinitis due to pollen: Secondary | ICD-10-CM | POA: Insufficient documentation

## 2013-05-14 DIAGNOSIS — K219 Gastro-esophageal reflux disease without esophagitis: Secondary | ICD-10-CM | POA: Insufficient documentation

## 2013-05-14 DIAGNOSIS — I10 Essential (primary) hypertension: Secondary | ICD-10-CM | POA: Insufficient documentation

## 2013-05-14 DIAGNOSIS — M129 Arthropathy, unspecified: Secondary | ICD-10-CM | POA: Insufficient documentation

## 2013-05-14 DIAGNOSIS — Z87891 Personal history of nicotine dependence: Secondary | ICD-10-CM | POA: Insufficient documentation

## 2013-05-14 HISTORY — PX: MASS EXCISION: SHX2000

## 2013-05-14 HISTORY — DX: Presence of spectacles and contact lenses: Z97.3

## 2013-05-14 LAB — POCT HEMOGLOBIN-HEMACUE: HEMOGLOBIN: 13.5 g/dL (ref 12.0–15.0)

## 2013-05-14 SURGERY — EXCISION MASS
Anesthesia: Monitor Anesthesia Care | Site: Finger | Laterality: Left

## 2013-05-14 MED ORDER — PROPOFOL INFUSION 10 MG/ML OPTIME
INTRAVENOUS | Status: DC | PRN
  Administered 2013-05-14: 75 ug/kg/min via INTRAVENOUS

## 2013-05-14 MED ORDER — MIDAZOLAM HCL 2 MG/2ML IJ SOLN: 0.5000 mg | Freq: Once | INTRAMUSCULAR | Status: DC | PRN

## 2013-05-14 MED ORDER — LACTATED RINGERS IV SOLN
INTRAVENOUS | Status: DC
  Administered 2013-05-14 (×2): via INTRAVENOUS

## 2013-05-14 MED ORDER — HYDROCODONE-ACETAMINOPHEN 5-325 MG PO TABS: ORAL_TABLET | ORAL | Status: DC

## 2013-05-14 MED ORDER — CEFAZOLIN SODIUM-DEXTROSE 2-3 GM-% IV SOLR
2.0000 g | INTRAVENOUS | Status: AC
  Administered 2013-05-14: 2 g via INTRAVENOUS

## 2013-05-14 MED ORDER — FENTANYL CITRATE 0.05 MG/ML IJ SOLN
INTRAMUSCULAR | Status: DC | PRN
  Administered 2013-05-14: 100 ug via INTRAVENOUS

## 2013-05-14 MED ORDER — BUPIVACAINE HCL (PF) 0.25 % IJ SOLN
INTRAMUSCULAR | Status: DC | PRN
  Administered 2013-05-14: 9 mL

## 2013-05-14 MED ORDER — FENTANYL CITRATE 0.05 MG/ML IJ SOLN
INTRAMUSCULAR | Status: AC
  Filled 2013-05-14: qty 2

## 2013-05-14 MED ORDER — OXYCODONE HCL 5 MG/5ML PO SOLN: 5.0000 mg | Freq: Once | ORAL | Status: DC | PRN

## 2013-05-14 MED ORDER — FENTANYL CITRATE 0.05 MG/ML IJ SOLN: 50.0000 ug | INTRAMUSCULAR | Status: DC | PRN

## 2013-05-14 MED ORDER — CHLORHEXIDINE GLUCONATE 4 % EX LIQD: 60.0000 mL | Freq: Once | CUTANEOUS | Status: DC

## 2013-05-14 MED ORDER — OXYCODONE HCL 5 MG PO TABS: 5.0000 mg | ORAL_TABLET | Freq: Once | ORAL | Status: DC | PRN

## 2013-05-14 MED ORDER — BUPIVACAINE HCL (PF) 0.25 % IJ SOLN
INTRAMUSCULAR | Status: AC
  Filled 2013-05-14: qty 30

## 2013-05-14 MED ORDER — CEFAZOLIN SODIUM-DEXTROSE 2-3 GM-% IV SOLR
INTRAVENOUS | Status: AC
  Filled 2013-05-14: qty 50

## 2013-05-14 MED ORDER — FENTANYL CITRATE 0.05 MG/ML IJ SOLN: 25.0000 ug | INTRAMUSCULAR | Status: DC | PRN

## 2013-05-14 MED ORDER — ONDANSETRON HCL 4 MG/2ML IJ SOLN
INTRAMUSCULAR | Status: DC | PRN
  Administered 2013-05-14: 4 mg via INTRAVENOUS

## 2013-05-14 MED ORDER — MIDAZOLAM HCL 2 MG/2ML IJ SOLN
INTRAMUSCULAR | Status: AC
  Filled 2013-05-14: qty 2

## 2013-05-14 MED ORDER — LIDOCAINE HCL (PF) 0.5 % IJ SOLN
INTRAMUSCULAR | Status: DC | PRN
  Administered 2013-05-14: 30 mL via INTRAVENOUS

## 2013-05-14 MED ORDER — MEPERIDINE HCL 25 MG/ML IJ SOLN: 6.2500 mg | INTRAMUSCULAR | Status: DC | PRN

## 2013-05-14 MED ORDER — PROPOFOL 10 MG/ML IV BOLUS
INTRAVENOUS | Status: DC | PRN
  Administered 2013-05-14: 50 mg via INTRAVENOUS

## 2013-05-14 MED ORDER — PROMETHAZINE HCL 25 MG/ML IJ SOLN: 6.2500 mg | INTRAMUSCULAR | Status: DC | PRN

## 2013-05-14 MED ORDER — MIDAZOLAM HCL 5 MG/5ML IJ SOLN
INTRAMUSCULAR | Status: DC | PRN
  Administered 2013-05-14: 2 mg via INTRAVENOUS

## 2013-05-14 MED ORDER — MIDAZOLAM HCL 2 MG/2ML IJ SOLN: 1.0000 mg | INTRAMUSCULAR | Status: DC | PRN

## 2013-05-14 SURGICAL SUPPLY — 53 items
APL SKNCLS STERI-STRIP NONHPOA (GAUZE/BANDAGES/DRESSINGS)
BANDAGE COBAN STERILE 2 (GAUZE/BANDAGES/DRESSINGS) ×2 IMPLANT
BANDAGE ELASTIC 3 VELCRO ST LF (GAUZE/BANDAGES/DRESSINGS) IMPLANT
BANDAGE GAUZE STRT 1 STR LF (GAUZE/BANDAGES/DRESSINGS) IMPLANT
BENZOIN TINCTURE PRP APPL 2/3 (GAUZE/BANDAGES/DRESSINGS) IMPLANT
BLADE MINI RND TIP GREEN BEAV (BLADE) IMPLANT
BLADE SURG 15 STRL LF DISP TIS (BLADE) ×2 IMPLANT
BLADE SURG 15 STRL SS (BLADE) ×4
BNDG CMPR 9X4 STRL LF SNTH (GAUZE/BANDAGES/DRESSINGS)
BNDG CMPR MD 5X2 ELC HKLP STRL (GAUZE/BANDAGES/DRESSINGS)
BNDG COHESIVE 1X5 TAN STRL LF (GAUZE/BANDAGES/DRESSINGS) IMPLANT
BNDG CONFORM 2 STRL LF (GAUZE/BANDAGES/DRESSINGS) IMPLANT
BNDG ELASTIC 2 VLCR STRL LF (GAUZE/BANDAGES/DRESSINGS) IMPLANT
BNDG ESMARK 4X9 LF (GAUZE/BANDAGES/DRESSINGS) IMPLANT
BNDG GAUZE ELAST 4 BULKY (GAUZE/BANDAGES/DRESSINGS) IMPLANT
BNDG PLASTER X FAST 3X3 WHT LF (CAST SUPPLIES) IMPLANT
BNDG PLSTR 9X3 FST ST WHT (CAST SUPPLIES)
CHLORAPREP W/TINT 26ML (MISCELLANEOUS) ×2 IMPLANT
CORDS BIPOLAR (ELECTRODE) ×2 IMPLANT
COVER MAYO STAND STRL (DRAPES) ×2 IMPLANT
COVER TABLE BACK 60X90 (DRAPES) ×2 IMPLANT
CUFF TOURNIQUET SINGLE 18IN (TOURNIQUET CUFF) ×2 IMPLANT
DRAPE EXTREMITY T 121X128X90 (DRAPE) ×2 IMPLANT
DRAPE SURG 17X23 STRL (DRAPES) ×2 IMPLANT
GAUZE XEROFORM 1X8 LF (GAUZE/BANDAGES/DRESSINGS) ×2 IMPLANT
GLOVE BIO SURGEON STRL SZ7.5 (GLOVE) ×2 IMPLANT
GLOVE BIOGEL PI IND STRL 7.0 (GLOVE) ×1 IMPLANT
GLOVE BIOGEL PI IND STRL 8 (GLOVE) ×1 IMPLANT
GLOVE BIOGEL PI INDICATOR 7.0 (GLOVE) ×1
GLOVE BIOGEL PI INDICATOR 8 (GLOVE) ×1
GLOVE ECLIPSE 6.5 STRL STRAW (GLOVE) ×2 IMPLANT
GOWN STRL REUS W/ TWL LRG LVL3 (GOWN DISPOSABLE) ×1 IMPLANT
GOWN STRL REUS W/TWL LRG LVL3 (GOWN DISPOSABLE) ×2
GOWN STRL REUS W/TWL XL LVL3 (GOWN DISPOSABLE) ×2 IMPLANT
NEEDLE HYPO 25X1 1.5 SAFETY (NEEDLE) ×2 IMPLANT
NS IRRIG 1000ML POUR BTL (IV SOLUTION) ×2 IMPLANT
PACK BASIN DAY SURGERY FS (CUSTOM PROCEDURE TRAY) ×2 IMPLANT
PAD CAST 3X4 CTTN HI CHSV (CAST SUPPLIES) IMPLANT
PAD CAST 4YDX4 CTTN HI CHSV (CAST SUPPLIES) IMPLANT
PADDING CAST ABS 4INX4YD NS (CAST SUPPLIES)
PADDING CAST ABS COTTON 4X4 ST (CAST SUPPLIES) IMPLANT
PADDING CAST COTTON 3X4 STRL (CAST SUPPLIES)
PADDING CAST COTTON 4X4 STRL (CAST SUPPLIES)
SPONGE GAUZE 4X4 12PLY (GAUZE/BANDAGES/DRESSINGS) ×2 IMPLANT
STOCKINETTE 4X48 STRL (DRAPES) ×2 IMPLANT
SUT ETHILON 3 0 PS 1 (SUTURE) IMPLANT
SUT ETHILON 4 0 PS 2 18 (SUTURE) ×2 IMPLANT
SUT ETHILON 5 0 P 3 18 (SUTURE)
SUT NYLON ETHILON 5-0 P-3 1X18 (SUTURE) IMPLANT
SYR BULB 3OZ (MISCELLANEOUS) ×2 IMPLANT
SYR CONTROL 10ML LL (SYRINGE) ×2 IMPLANT
TOWEL OR 17X24 6PK STRL BLUE (TOWEL DISPOSABLE) ×4 IMPLANT
UNDERPAD 30X30 INCONTINENT (UNDERPADS AND DIAPERS) ×2 IMPLANT

## 2013-05-14 NOTE — H&P (Signed)
Vanessa Patel is an 58 y.o. female.   Chief Complaint: left long finger mass HPI: 58 yo lhd female with mass left long finger.  States it has been there for years.  Pain with grip.  It is bothersome to her and she wishes to have it excised.  Past Medical History  Diagnosis Date  . Environmental allergies   . Hypertension   . GERD (gastroesophageal reflux disease)   . Seasonal allergies   . Depression   . Arthritis   . Wears glasses     Past Surgical History  Procedure Laterality Date  . Left wrist      2007 left wrist plate fracture  . Appendectomy    . Colonoscopy    . Tendon repair  06/08/2011    Procedure: TENDON REPAIR;  Surgeon: Tennis Must, MD;  Location: [place];  Service: Orthopedics;  Laterality: Left;  LEFT FPL RECONSTRUCTION   . Hardware removal  06/08/2011    Procedure: HARDWARE REMOVAL;  Surgeon: Tennis Must, MD;  Location: [place];  Service: Orthopedics;  Laterality: Left;  FROM LEFT WRIST    Family History  Problem Relation Age of Onset  . Colon cancer Neg Hx    Social History:  reports that she quit smoking about 22 years ago. She has never used smokeless tobacco. She reports that she drinks about 1.5 ounces of alcohol per week. She reports that she does not use illicit drugs.  Allergies: No Known Allergies  Medications Prior to Admission  Medication Sig Dispense Refill  . Biotin 10 MG CAPS Take by mouth.      . Calcium Carbonate-Vitamin D (CALCIUM 500 + D) 500-125 MG-UNIT TABS Take by mouth 2 (two) times daily.        . cetirizine (ZYRTEC) 10 MG tablet Take 10 mg by mouth daily.      Marland Kitchen FLUoxetine (PROZAC) 20 MG capsule       . hydrochlorothiazide (HYDRODIURIL) 25 MG tablet       . lisinopril (PRINIVIL,ZESTRIL) 10 MG tablet Take 10 mg by mouth daily.      Marland Kitchen omeprazole (PRILOSEC) 20 MG capsule Take 20 mg by mouth daily.          Results for orders placed during the hospital encounter of 05/14/13 (from the past 48  hour(s))  POCT HEMOGLOBIN-HEMACUE     Status: None   Collection Time    05/14/13  7:52 AM      Result Value Ref Range   Hemoglobin 13.5  12.0 - 15.0 g/dL    No results found.   A comprehensive review of systems was negative except for: Eyes: positive for contacts/glasses  Blood pressure 128/79, pulse 77, temperature 98.1 F (36.7 C), temperature source Oral, resp. rate 20, height 5\' 2"  (1.575 m), weight 60.782 kg (134 lb), SpO2 98.00%.  General appearance: alert, cooperative and appears stated age Head: Normocephalic, without obvious abnormality, atraumatic Neck: supple, symmetrical, trachea midline Resp: clear to auscultation bilaterally Cardio: regular rate and rhythm GI: non tender Extremities: intact sensation and capillary refill all digits.  +epl/fpl/io.  no wounds. Pulses: 2+ and symmetric Skin: Skin color, texture, turgor normal. No rashes or lesions Neurologic: Grossly normal Incision/Wound: none  Assessment/Plan Left long finger mass.  Non operative and operative treatment options were discussed with the patient and patient wishes to proceed with operative treatment. Risks, benefits, and alternatives of surgery were discussed and the patient agrees with the plan of care.  Antuane Eastridge R 05/14/2013, 8:20 AM

## 2013-05-14 NOTE — Discharge Instructions (Addendum)

## 2013-05-14 NOTE — Anesthesia Preprocedure Evaluation (Addendum)
Anesthesia Evaluation  Patient identified by MRN, date of birth, ID band Patient awake    Reviewed: Allergy & Precautions, H&P , NPO status , Patient's Chart, lab work & pertinent test results  History of Anesthesia Complications Negative for: history of anesthetic complications  Airway Mallampati: II TM Distance: >3 FB Neck ROM: Full    Dental  (+) Caps, Dental Advisory Given   Pulmonary former smoker (quit '92),  breath sounds clear to auscultation  Pulmonary exam normal       Cardiovascular hypertension, Pt. on medications - anginaRhythm:Regular Rate:Normal     Neuro/Psych Depression negative neurological ROS     GI/Hepatic Neg liver ROS, GERD-  Medicated and Controlled,  Endo/Other  negative endocrine ROS  Renal/GU negative Renal ROS     Musculoskeletal   Abdominal   Peds  Hematology   Anesthesia Other Findings   Reproductive/Obstetrics                          Anesthesia Physical Anesthesia Plan  ASA: II  Anesthesia Plan: MAC and Bier Block   Post-op Pain Management:    Induction:   Airway Management Planned: Natural Airway  Additional Equipment:   Intra-op Plan:   Post-operative Plan:   Informed Consent: I have reviewed the patients History and Physical, chart, labs and discussed the procedure including the risks, benefits and alternatives for the proposed anesthesia with the patient or authorized representative who has indicated his/her understanding and acceptance.   Dental advisory given  Plan Discussed with: CRNA and Surgeon  Anesthesia Plan Comments: (Plan routine monitors, MAC with IV regional lidocaine)        Anesthesia Quick Evaluation

## 2013-05-14 NOTE — Anesthesia Postprocedure Evaluation (Signed)
  Anesthesia Post-op Note  Patient: Vanessa Patel  Procedure(s) Performed: Procedure(s): EXCISION MASS LEFT LONG FINGER (Left)  Patient Location: PACU  Anesthesia Type:MAC and Bier block  Level of Consciousness: awake, alert , oriented and patient cooperative  Airway and Oxygen Therapy: Patient Spontanous Breathing  Post-op Pain: none  Post-op Assessment: Post-op Vital signs reviewed, Patient's Cardiovascular Status Stable, Respiratory Function Stable, Patent Airway, No signs of Nausea or vomiting and Pain level controlled  Post-op Vital Signs: Reviewed and stable  Complications: No apparent anesthesia complications

## 2013-05-14 NOTE — Transfer of Care (Signed)
Immediate Anesthesia Transfer of Care Note  Patient: Vanessa Patel  Procedure(s) Performed: Procedure(s): EXCISION MASS LEFT LONG FINGER (Left)  Patient Location: PACU  Anesthesia Type:MAC and Bier block  Level of Consciousness: awake, alert  and oriented  Airway & Oxygen Therapy: Patient Spontanous Breathing and Patient connected to nasal cannula oxygen  Post-op Assessment: Report given to PACU RN and Post -op Vital signs reviewed and stable  Post vital signs: Reviewed and stable  Complications: No apparent anesthesia complications

## 2013-05-14 NOTE — Anesthesia Procedure Notes (Signed)
Anesthesia Regional Block: Narrative:       

## 2013-05-14 NOTE — Op Note (Signed)
[id number] 

## 2013-05-14 NOTE — Brief Op Note (Signed)
05/14/2013  9:57 AM  PATIENT:  Vanessa Patel  58 y.o. female  PRE-OPERATIVE DIAGNOSIS:  LEFT LONG FINGER MASS  POST-OPERATIVE DIAGNOSIS:  Left Long Finger Mass  PROCEDURE:  Procedure(s): EXCISION MASS LEFT LONG FINGER (Left)  SURGEON:  Surgeon(s) and Role:    * Tennis Must, MD - Primary  PHYSICIAN ASSISTANT:   ASSISTANTS: none   ANESTHESIA:   Bier block with sedation  EBL:  Total I/O In: 1000 [I.V.:1000] Out: -   BLOOD ADMINISTERED:none  DRAINS: none   LOCAL MEDICATIONS USED:  MARCAINE     SPECIMEN:  Source of Specimen:  left long finger  DISPOSITION OF SPECIMEN:  PATHOLOGY  COUNTS:  YES  TOURNIQUET:   Total Tourniquet Time Documented: Forearm (Left) - 25 minutes Total: Forearm (Left) - 25 minutes   DICTATION: .Other Dictation: Dictation Number [id number]  PLAN OF CARE: Discharge to home after PACU  PATIENT DISPOSITION:  PACU - hemodynamically stable.

## 2013-05-15 ENCOUNTER — Encounter (HOSPITAL_BASED_OUTPATIENT_CLINIC_OR_DEPARTMENT_OTHER): Payer: Self-pay | Admitting: Orthopedic Surgery

## 2013-05-15 NOTE — Op Note (Signed)
Vanessa Patel, Vanessa Patel              ACCOUNT NO.:  [id number]  MEDICAL RECORD NO.:  [id number]  LOCATION:                                 FACILITY:  PHYSICIAN:  Leanora Cover, MD             DATE OF BIRTH:  DATE OF PROCEDURE:  05/14/2013 DATE OF DISCHARGE:                              OPERATIVE REPORT   PREOPERATIVE DIAGNOSIS:  Left long finger mass.  POSTOPERATIVE DIAGNOSIS:  Left long finger mass.  PROCEDURE:  Excision of mass, left long finger.  SURGEON:  Leanora Cover, MD  ASSISTANT:  None.  ANESTHESIA:  Bier block with sedation.  IV FLUIDS:  Per anesthesia flow sheet.  ESTIMATED BLOOD LOSS:  Minimal.  COMPLICATIONS:  None.  SPECIMENS:  Left long finger mass to Pathology.  TOURNIQUET TIME:  25 minutes.  DISPOSITION:  Stable to PACU.  INDICATIONS:  Vanessa Patel is a 58 year old left-hand dominant female who has noted a mass in the volar aspect of the left long finger for few years.  It is bothersome to her.  She has pain with grip.  She wished to have it excised.  Risks, benefits and alternatives of surgery were discussed including risk of blood loss; infection; damage to nerves, vessels, tendons, ligaments, bone; failure of surgery; need for additional surgery; complications with wound healing; continued pain and recurrence of mass.  She voiced understanding of these risks and elected to proceed.  OPERATIVE COURSE:  After being identified preoperatively by myself, the patient and I agreed upon the procedure and site of procedure.  The surgical site was marked.  The risks, benefits, and alternatives of surgery were reviewed and she wished to proceed.  Surgical consent had been signed.  She was given 1 g of IV Ancef as preoperative antibiotic prophylaxis.  She was transferred to the operating room and placed on the operating room table in supine position where the left upper extremity on an armboard.  Bier block anesthesia was induced by anesthesiologist.  The left  upper extremity was prepped and draped in normal sterile orthopedic fashion.  A surgical pause was performed between the surgeons, anesthesia, and operating room staff, and all were in agreement as to the patient, procedure, and site of procedure. Tourniquet at the proximal aspect of the extremity had been inflated for the Bier block.  Incision was made over the volar aspect at the proximal phalanx of the left long finger.  This was carried into the subcutaneous tissues by spreading technique.  The radial digital nerve was identified and was went in over the volar radial aspect of the mass.  The mass appeared to be a ganglion cyst.  The nerve was freed up and lightly retracted.  The mass was freed of its soft tissue attachments.  It was coming from the A2 pulley.  The mass was removed and a small window of A2 pulley removed as well to try and prevent recurrence of the mass. The tendons were intact.  The wound was inspected and no other evidence of mass was present.  The radial and ulnar neurovascular bundles were identified and were intact.  The wound was copiously irrigated with sterile saline.  It was closed with 4-0 nylon in a horizontal mattress fashion.  A digital block was performed with 9 mL of 0.25% plain Marcaine to aid in postoperative analgesia.  The wound was then dressed with sterile Xeroform, 4x4s, and wrapped with a Coban dressing lightly. Tourniquet was deflated at 25 minutes.  Fingertips were pink with brisk capillary refill after deflation of the tourniquet.  Operative drapes were broken down.  The patient was awoken from anesthesia safely.  She was transferred back to stretcher and taken to the PACU in stable condition.  I will see her back in the office in 1 week for postoperative followup.  I will give her Norco 5/325, 1-2 p.o. q.6 hours p.r.n. pain, dispensed #30.     Leanora Cover, MD     KK/MEDQ  D:  05/14/2013  T:  05/15/2013  Job:  [id number]

## 2013-08-02 IMAGING — CR DG ELBOW COMPLETE 3+V*L*
4 series · 4 of 4 positions shown · non-contrast
Comparison: None.

CLINICAL DATA: Fall, pain.

LEFT ELBOW - COMPLETE 3+ VIEW

[x elbow ap left]
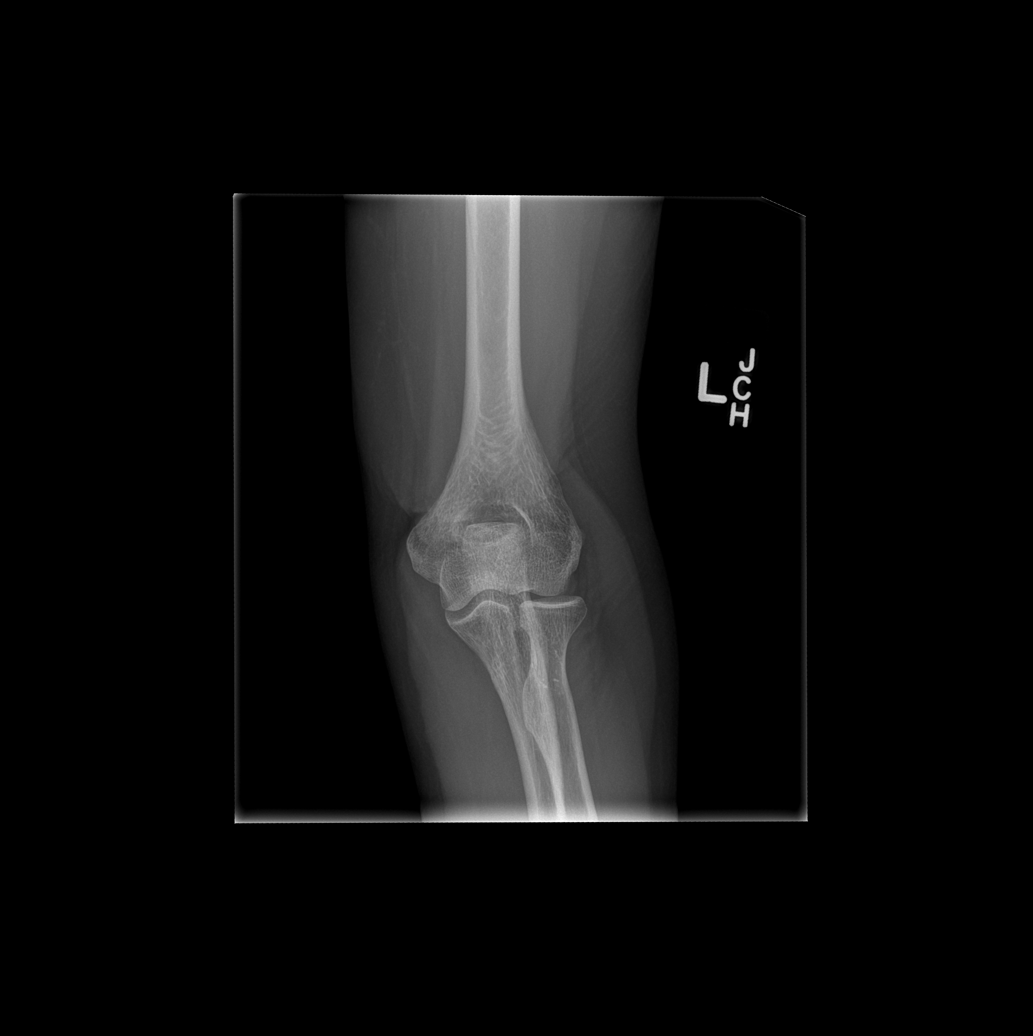

[x elbow obl left (1 of 2)]
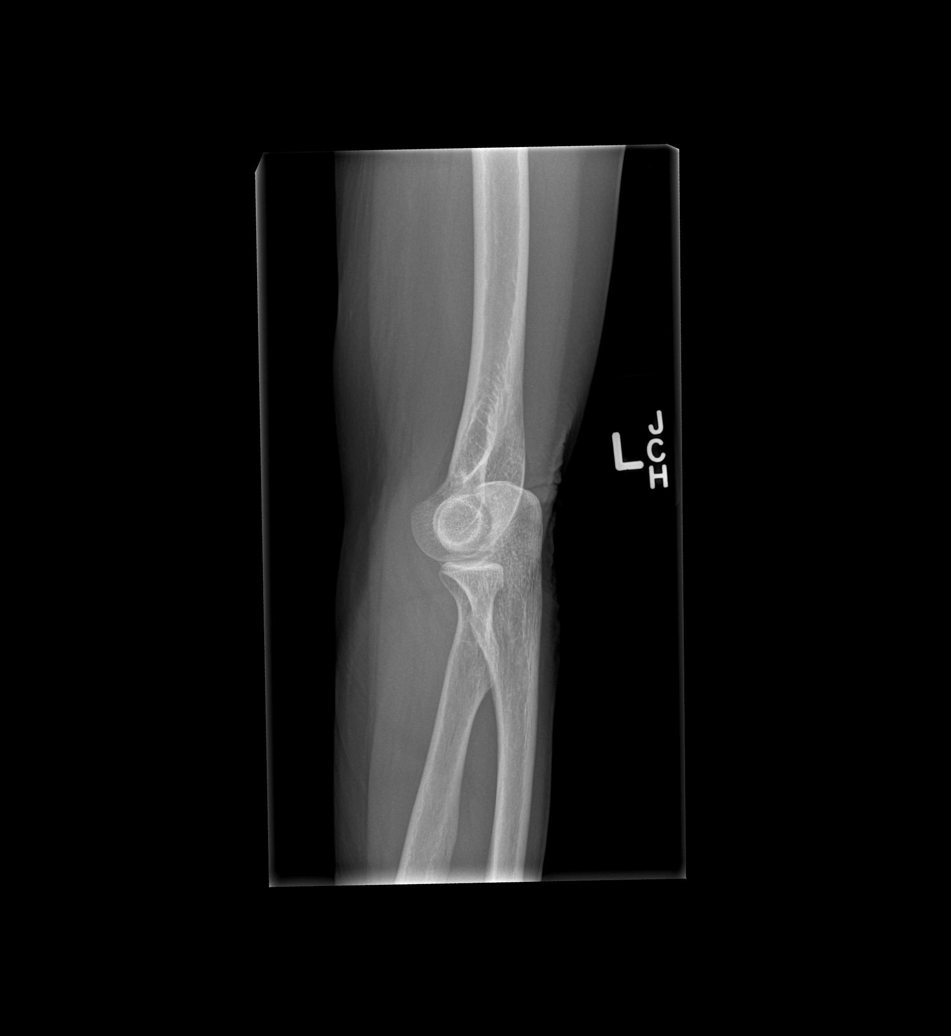

[x elbow obl left (2 of 2)]
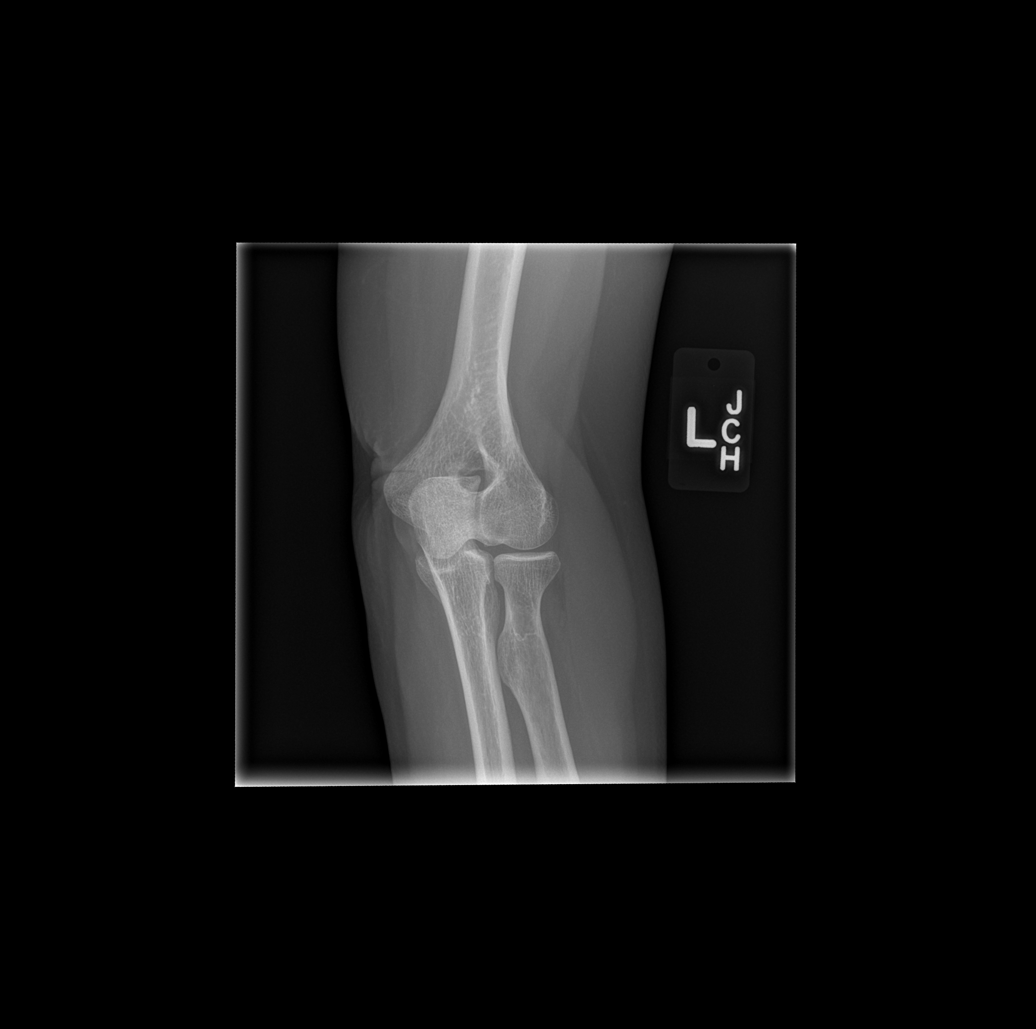

[x elbow lat left]
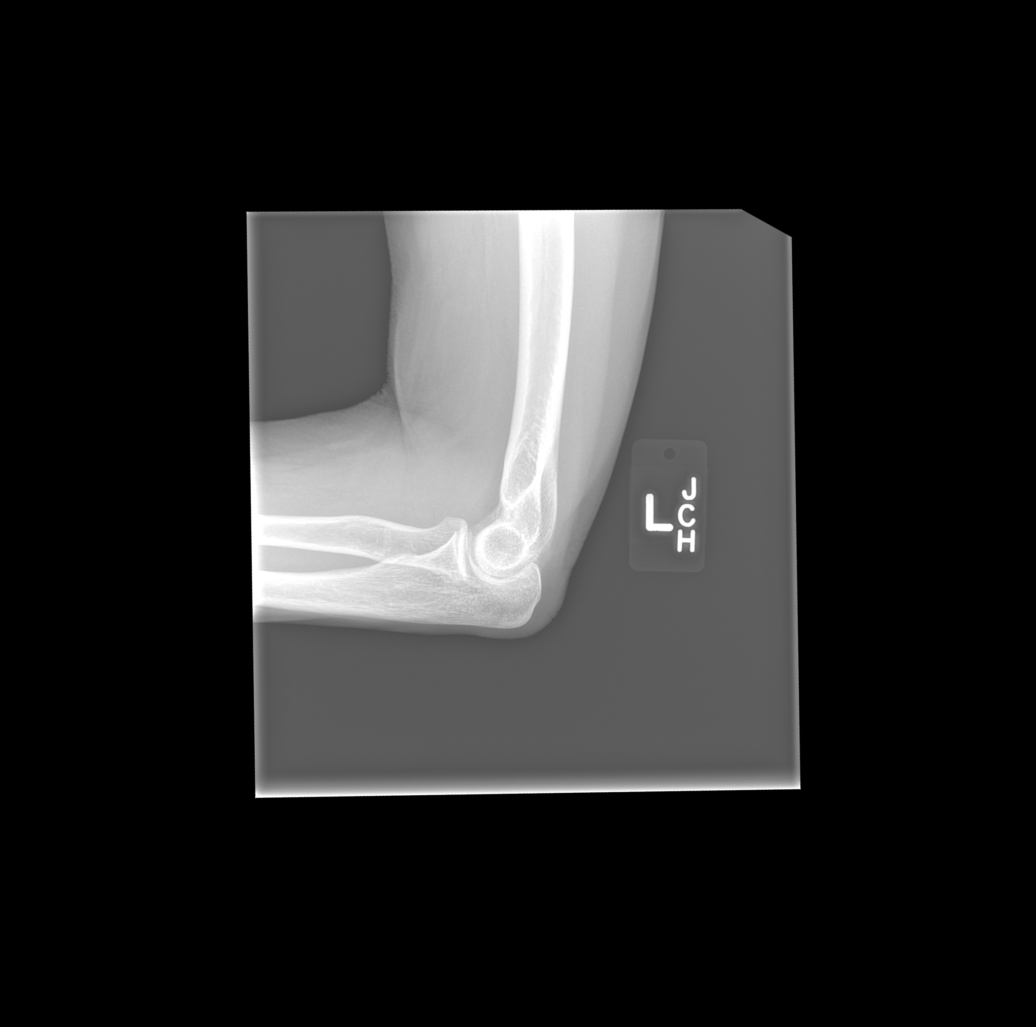

[4 of 4 positions shown; findings below may reference images not displayed]

FINDINGS: Imaged bones, joints and soft tissues appear normal.
IMPRESSION: Negative exam.

## 2013-12-27 ENCOUNTER — Other Ambulatory Visit (HOSPITAL_COMMUNITY): Payer: Self-pay | Admitting: Obstetrics and Gynecology

## 2013-12-27 DIAGNOSIS — Z1231 Encounter for screening mammogram for malignant neoplasm of breast: Secondary | ICD-10-CM

## 2014-01-02 ENCOUNTER — Ambulatory Visit (HOSPITAL_COMMUNITY)
Admission: RE | Admit: 2014-01-02 | Discharge: 2014-01-02 | Disposition: A | Discharge status: Home / Self Care | Payer: BC Managed Care – PPO | Source: Ambulatory Visit | Attending: Obstetrics and Gynecology | Admitting: Obstetrics and Gynecology

## 2014-01-02 DIAGNOSIS — Z1231 Encounter for screening mammogram for malignant neoplasm of breast: Secondary | ICD-10-CM

## 2015-09-15 ENCOUNTER — Encounter: Payer: Self-pay | Admitting: Internal Medicine

## 2016-02-11 DIAGNOSIS — Z124 Encounter for screening for malignant neoplasm of cervix: Secondary | ICD-10-CM | POA: Diagnosis not present

## 2016-02-11 DIAGNOSIS — Z1231 Encounter for screening mammogram for malignant neoplasm of breast: Secondary | ICD-10-CM | POA: Diagnosis not present

## 2016-02-11 DIAGNOSIS — Z6824 Body mass index (BMI) 24.0-24.9, adult: Secondary | ICD-10-CM | POA: Diagnosis not present

## 2016-02-11 DIAGNOSIS — M810 Age-related osteoporosis without current pathological fracture: Secondary | ICD-10-CM | POA: Diagnosis not present

## 2016-02-11 DIAGNOSIS — Z01419 Encounter for gynecological examination (general) (routine) without abnormal findings: Secondary | ICD-10-CM | POA: Diagnosis not present

## 2016-03-12 DIAGNOSIS — F329 Major depressive disorder, single episode, unspecified: Secondary | ICD-10-CM | POA: Diagnosis not present

## 2016-03-12 DIAGNOSIS — M810 Age-related osteoporosis without current pathological fracture: Secondary | ICD-10-CM | POA: Diagnosis not present

## 2016-03-12 DIAGNOSIS — I10 Essential (primary) hypertension: Secondary | ICD-10-CM | POA: Diagnosis not present

## 2016-03-12 DIAGNOSIS — J309 Allergic rhinitis, unspecified: Secondary | ICD-10-CM | POA: Diagnosis not present

## 2016-03-29 DIAGNOSIS — E876 Hypokalemia: Secondary | ICD-10-CM | POA: Diagnosis not present

## 2016-06-10 ENCOUNTER — Ambulatory Visit (INDEPENDENT_AMBULATORY_CARE_PROVIDER_SITE_OTHER): Payer: BLUE CROSS/BLUE SHIELD

## 2016-06-10 ENCOUNTER — Encounter: Payer: Self-pay | Admitting: Podiatry

## 2016-06-10 ENCOUNTER — Ambulatory Visit (INDEPENDENT_AMBULATORY_CARE_PROVIDER_SITE_OTHER): Payer: BLUE CROSS/BLUE SHIELD | Admitting: Podiatry

## 2016-06-10 VITALS — Ht 62.0 in | Wt 125.0 lb

## 2016-06-10 DIAGNOSIS — M779 Enthesopathy, unspecified: Secondary | ICD-10-CM

## 2016-06-10 DIAGNOSIS — M2011 Hallux valgus (acquired), right foot: Secondary | ICD-10-CM | POA: Diagnosis not present

## 2016-06-10 DIAGNOSIS — M79674 Pain in right toe(s): Secondary | ICD-10-CM

## 2016-06-10 MED ORDER — TRIAMCINOLONE ACETONIDE 10 MG/ML IJ SUSP
10.0000 mg | Freq: Once | INTRAMUSCULAR | Status: AC
  Administered 2016-06-10: 10 mg

## 2016-06-10 NOTE — Patient Instructions (Signed)
Bunionectomy, Care After Refer to this sheet in the next few weeks. These instructions provide you with information about caring for yourself after your procedure. Your health care provider may also give you more specific instructions. Your treatment has been planned according to current medical practices, but problems sometimes occur. Call your health care provider if you have any problems or questions after your procedure. What can I expect after the procedure? After your procedure, it is typical to have the following:  Pain.  Swelling. Follow these instructions at home:  Take medicines only as directed by your health care provider.  Apply ice to the affected area:  Put ice in a plastic bag.  Place a towel between your skin and the bag.  Leave the ice on for 20 minutes, 2-3 times a day.  There are many different ways to close and cover an incision, including stitches (sutures), skin glue, and adhesive strips. Follow your health care provider's instructions about:  Incision care.  Bandage (dressing) changes and removal.  Incision closure removal.  Keep your foot raised (elevated) while you are resting.  You may need to wear a brace or a boot that keeps your foot in the proper position. Wear the boot or brace as directed by your health care provider.  Use crutches, canes, or walkers as directed by your health care provider.  Do not put weight on your foot until your health care provider approves.  Rest as needed. Follow your health care provider's instructions on when you can return to your usual activities, like walking and driving.  Do not wear high heels or tight-fitting shoes.  Do physical therapy exercises to strengthen your foot as directed by your health care provider. Contact a health care provider if:  You have increased bleeding from the incision.  You have drainage, redness, swelling, or pain at your incision site.  You have a fever or chills.  You notice a  bad smell coming from the incision or the dressing.  Your dressing gets wet or falls off.  Your calf begins to swell.  Your toes feel numb or stiff. Get help right away if:  You have a rash.  You have difficulty breathing. This information is not intended to replace advice given to you by your health care provider. Make sure you discuss any questions you have with your health care provider. Document Released: 08/14/2004 Document Revised: 07/03/2015 Document Reviewed: 09/12/2013 Elsevier Interactive Patient Education  2017 [employer].

## 2016-06-10 NOTE — Progress Notes (Signed)
   Subjective:    Patient ID: Vanessa Patel, female    DOB: 10/16/1955, 61 y.o.   MRN: [id number]  HPI  61 year old female presents the office today for concerns of right big toe joint pain which is been ongoing for about 3 weeks. She states that she has some pain mostly after walking or wearing shoes for sometime along the bunion site. She does have a strong family history of bunion deformity. She has noticed some mild swelling along the bunion. No numbness or tingling. She said no recent treatment for this. No injury. No other complaints.  Review of Systems  All other systems reviewed and are negative.      Objective:   Physical Exam General: AAO x3, NAD  Dermatological: Mild erythema along the medial aspect of the right bunion from irritation issues but there is no fluctuance or crepitus or any warmth or any signs of infection.  Vascular: Dorsalis Pedis artery and Posterior Tibial artery pedal pulses are 2/4 bilateral with immedate capillary fill time. There is no pain with calf compression, swelling, warmth, erythema.   Neruologic: Grossly intact via light touch bilateral. Vibratory intact via tuning fork bilateral. Protective threshold with Semmes Wienstein monofilament intact to all pedal sites bilateral.   Musculoskeletal: On the right foot there is moderate bunion present. There is mild tenderness on the medial aspect of the right first metatarsal head on the dorsal aspect of the first MTPJ. There is minimal edema to this area. There is no pain with MPJ range of motion is no hypermobility present. There is no other areas of tenderness identified bilaterally. Muscular strength 5/5 in all groups tested bilateral.  Gait: Unassisted, Nonantalgic.     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 HAV is present. No evidence of acute fracture. -Discussed steroid injection she wishes to proceed. Under sterile conditions a mixture of Kenalog and local anesthetic was infiltrated into and around the first MTPJ on the area of maximal tenderness without complications. Post injection care discussed. -Offloading pads were dispensed. Discussed shoe gear changes. -I discussed with her treatment options including surgical intervention the future  Celesta Gentile, DPM

## 2016-09-15 DIAGNOSIS — F329 Major depressive disorder, single episode, unspecified: Secondary | ICD-10-CM | POA: Diagnosis not present

## 2016-09-15 DIAGNOSIS — Z0000 Encounter for general adult medical examination without abnormal findings: Secondary | ICD-10-CM | POA: Diagnosis not present

## 2016-09-15 DIAGNOSIS — M810 Age-related osteoporosis without current pathological fracture: Secondary | ICD-10-CM | POA: Diagnosis not present

## 2016-09-15 DIAGNOSIS — I10 Essential (primary) hypertension: Secondary | ICD-10-CM | POA: Diagnosis not present

## 2016-09-15 DIAGNOSIS — Z13220 Encounter for screening for lipoid disorders: Secondary | ICD-10-CM | POA: Diagnosis not present

## 2016-11-12 ENCOUNTER — Encounter: Payer: Self-pay | Admitting: Internal Medicine

## 2016-12-23 ENCOUNTER — Other Ambulatory Visit: Payer: Self-pay

## 2016-12-23 ENCOUNTER — Ambulatory Visit (AMBULATORY_SURGERY_CENTER): Payer: Self-pay | Admitting: *Deleted

## 2016-12-23 VITALS — Ht 62.0 in | Wt 129.0 lb

## 2016-12-23 DIAGNOSIS — Z86010 Personal history of colonic polyps: Secondary | ICD-10-CM

## 2016-12-23 MED ORDER — NA SULFATE-K SULFATE-MG SULF 17.5-3.13-1.6 GM/177ML PO SOLN: 1.0000 | Freq: Once | ORAL | 0 refills | Status: AC

## 2016-12-23 NOTE — Progress Notes (Signed)
No egg or soy allergy known to patient  No issues with past sedation with any surgeries  or procedures, no intubation problems  No diet pills per patient No home 02 use per patient  No blood thinners per patient  Pt denies issues with constipation  No A fib or A flutter  EMMI video sent to pt's e mail  $15 coupon to pt for prep

## 2017-01-06 ENCOUNTER — Encounter: Payer: Self-pay | Admitting: Internal Medicine

## 2017-01-26 ENCOUNTER — Encounter: Payer: Self-pay | Admitting: Internal Medicine

## 2017-01-26 ENCOUNTER — Other Ambulatory Visit: Payer: Self-pay

## 2017-01-26 ENCOUNTER — Ambulatory Visit (AMBULATORY_SURGERY_CENTER): Payer: BLUE CROSS/BLUE SHIELD | Admitting: Internal Medicine

## 2017-01-26 VITALS — BP 114/63 | HR 74 | Temp 98.6°F | Resp 15 | Ht 62.0 in | Wt 129.0 lb

## 2017-01-26 DIAGNOSIS — Z86010 Personal history of colonic polyps: Secondary | ICD-10-CM | POA: Diagnosis present

## 2017-01-26 MED ORDER — SODIUM CHLORIDE 0.9 % IV SOLN: 500.0000 mL | INTRAVENOUS | Status: AC

## 2017-01-26 NOTE — Progress Notes (Signed)
  [facility] Post-op Note  Patient: Vanessa Patel  Procedure(s) Performed: colonoscopy  Patient Location: [facility] - Recovery Area  Anesthesia Type: Deep Sedation/Propofol  Level of Consciousness: awake, oriented and patient cooperative  Airway and Oxygen Therapy: Patient Spontanous Breathing  Post-op Pain: none  Post-op Assessment:  Post-op Vital signs reviewed, Patient's Cardiovascular Status Stable, Respiratory Function Stable, Patent Airway, No signs of Nausea or vomiting and Pain level controlled  Post-op Vital Signs: Reviewed and stable  Complications: No apparent anesthesia complications  Yarielys Beed E Cordelia Bessinger 9:48 AM

## 2017-01-26 NOTE — Patient Instructions (Signed)
YOU HAD AN ENDOSCOPIC PROCEDURE TODAY AT [facility]:   Refer to the procedure report that was given to you for any specific questions about what was found during the examination.  If the procedure report does not answer your questions, please call your gastroenterologist to clarify.  If you requested that your care partner not be given the details of your procedure findings, then the procedure report has been included in a sealed envelope for you to review at your convenience later.  YOU SHOULD EXPECT: Some feelings of bloating in the abdomen. Passage of more gas than usual.  Walking can help get rid of the air that was put into your GI tract during the procedure and reduce the bloating. If you had a lower endoscopy (such as a colonoscopy or flexible sigmoidoscopy) you may notice spotting of blood in your stool or on the toilet paper. If you underwent a bowel prep for your procedure, you may not have a normal bowel movement for a few days.  Please Note:  You might notice some irritation and congestion in your nose or some drainage.  This is from the oxygen used during your procedure.  There is no need for concern and it should clear up in a day or so.  SYMPTOMS TO REPORT IMMEDIATELY:   Following lower endoscopy (colonoscopy or flexible sigmoidoscopy):  Excessive amounts of blood in the stool  Significant tenderness or worsening of abdominal pains  Swelling of the abdomen that is new, acute  Fever of 100F or higher      For urgent or emergent issues, a gastroenterologist can be reached at any hour by calling [phone].   DIET:  We do recommend a small meal at first, but then you may proceed to your regular diet.  Drink plenty of fluids but you should avoid alcoholic beverages for 24 hours.  ACTIVITY:  You should plan to take it easy for the rest of today and you should NOT DRIVE or use heavy machinery until tomorrow (because of the sedation medicines used during the  test).    FOLLOW UP: Our staff will call the number listed on your records the next business day following your procedure to check on you and address any questions or concerns that you may have regarding the information given to you following your procedure. If we do not reach you, we will leave a message.  However, if you are feeling well and you are not experiencing any problems, there is no need to return our call.  We will assume that you have returned to your regular daily activities without incident.  If any biopsies were taken you will be contacted by phone or by letter within the next 1-3 weeks.  Please call us at [phone] if you have not heard about the biopsies in 3 weeks.    SIGNATURES/CONFIDENTIALITY: You and/or your care partner have signed paperwork which will be entered into your electronic medical record.  These signatures attest to the fact that that the information above on your After Visit Summary has been reviewed and is understood.  Full responsibility of the confidentiality of this discharge information lies with you and/or your care-partner.  Diverticulosis information given.  Recall colonoscopy 5 years-2023

## 2017-01-26 NOTE — Op Note (Signed)
[name] Procedure Date: 01/26/2017 9:14 AM MRN: [id number] Endoscopist: Jerene Bears , MD Age: 61 Referring MD:  Date of Birth: 1955-08-25 Gender: Female Account #: [id number] Procedure:                Colonoscopy Indications:              High risk colon cancer surveillance: Personal                            history of non-advanced adenoma, Last colonoscopy:                            2012 Medicines:                Monitored Anesthesia Care Procedure:                Pre-Anesthesia Assessment:                           - Prior to the procedure, a History and Physical                            was performed, and patient medications and                            allergies were reviewed. The patient's tolerance of                            previous anesthesia was also reviewed. The risks                            and benefits of the procedure and the sedation                            options and risks were discussed with the patient.                            All questions were answered, and informed consent                            was obtained. Prior Anticoagulants: The patient has                            taken no previous anticoagulant or antiplatelet                            agents. ASA Grade Assessment: II - A patient with                            mild systemic disease. After reviewing the risks                            and benefits, the patient was deemed in  satisfactory condition to undergo the procedure.                           After obtaining informed consent, the colonoscope                            was passed under direct vision. Throughout the                            procedure, the patient's blood pressure, pulse, and                            oxygen saturations were monitored continuously. The                            Model PCF-[id number]) scope was introduced                       through the anus and advanced to the the cecum,                            identified by appendiceal orifice and ileocecal                            valve. The colonoscopy was performed without                            difficulty. The patient tolerated the procedure                            well. The quality of the bowel preparation was                            good. The ileocecal valve, appendiceal orifice, and                            rectum were photographed. Scope In: 9:25:37 AM Scope Out: 9:42:20 AM Scope Withdrawal Time: 0 hours 7 minutes 10 seconds  Total Procedure Duration: 0 hours 16 minutes 43 seconds  Findings:                 The digital rectal exam was normal.                           A few small-mouthed diverticula were found in the                            sigmoid colon.                           The exam was otherwise without abnormality on                            direct and retroflexion views. Complications:            No immediate complications. Estimated Blood Loss:  Estimated blood loss: none. Impression:               - Mild diverticulosis in the sigmoid colon.                           - The examination was otherwise normal on direct                            and retroflexion views.                           - No specimens collected. Recommendation:           - Patient has a contact number available for                            emergencies. The signs and symptoms of potential                            delayed complications were discussed with the                            patient. Return to normal activities tomorrow.                            Written discharge instructions were provided to the                            patient.                           - Resume previous diet.                           - Continue present medications.                           - Await pathology results.                           -  Repeat colonoscopy in 5 years for surveillance                            (based on personal history of colonic adenoma and                            your mother's history of an advanced colon polyp). Jerene Bears, MD 01/26/2017 9:45:20 AM This report has been signed electronically.

## 2017-01-27 ENCOUNTER — Telehealth: Payer: Self-pay

## 2017-01-27 NOTE — Telephone Encounter (Signed)
  Follow up Call-  Call back number 01/26/2017  Post procedure Call Back phone  # [phone]  Permission to leave phone message Yes  Some recent data might be hidden     Patient questions:  Do you have a fever, pain , or abdominal swelling? No. Pain Score  0 *  Have you tolerated food without any problems? Yes.    Have you been able to return to your normal activities? Yes.    Do you have any questions about your discharge instructions: Diet   No. Medications  No. Follow up visit  No.  Do you have questions or concerns about your Care? No.  Actions: * If pain score is 4 or above: No action needed, pain <4.

## 2017-03-23 DIAGNOSIS — J309 Allergic rhinitis, unspecified: Secondary | ICD-10-CM | POA: Diagnosis not present

## 2017-03-23 DIAGNOSIS — F329 Major depressive disorder, single episode, unspecified: Secondary | ICD-10-CM | POA: Diagnosis not present

## 2017-03-23 DIAGNOSIS — I10 Essential (primary) hypertension: Secondary | ICD-10-CM | POA: Diagnosis not present

## 2017-04-11 DIAGNOSIS — R42 Dizziness and giddiness: Secondary | ICD-10-CM | POA: Diagnosis not present

## 2017-04-11 DIAGNOSIS — M542 Cervicalgia: Secondary | ICD-10-CM | POA: Diagnosis not present

## 2017-04-11 DIAGNOSIS — R5383 Other fatigue: Secondary | ICD-10-CM | POA: Diagnosis not present

## 2017-04-13 DIAGNOSIS — Z6824 Body mass index (BMI) 24.0-24.9, adult: Secondary | ICD-10-CM | POA: Diagnosis not present

## 2017-04-13 DIAGNOSIS — Z01419 Encounter for gynecological examination (general) (routine) without abnormal findings: Secondary | ICD-10-CM | POA: Diagnosis not present

## 2017-04-13 DIAGNOSIS — Z1231 Encounter for screening mammogram for malignant neoplasm of breast: Secondary | ICD-10-CM | POA: Diagnosis not present

## 2017-04-13 DIAGNOSIS — M810 Age-related osteoporosis without current pathological fracture: Secondary | ICD-10-CM | POA: Diagnosis not present

## 2017-04-13 DIAGNOSIS — Z124 Encounter for screening for malignant neoplasm of cervix: Secondary | ICD-10-CM | POA: Diagnosis not present

## 2017-06-23 ENCOUNTER — Encounter: Payer: Self-pay | Admitting: Podiatry

## 2017-06-23 ENCOUNTER — Ambulatory Visit (INDEPENDENT_AMBULATORY_CARE_PROVIDER_SITE_OTHER): Payer: BLUE CROSS/BLUE SHIELD | Admitting: Podiatry

## 2017-06-23 ENCOUNTER — Encounter

## 2017-06-23 DIAGNOSIS — L84 Corns and callosities: Secondary | ICD-10-CM

## 2017-06-23 DIAGNOSIS — M2011 Hallux valgus (acquired), right foot: Secondary | ICD-10-CM | POA: Diagnosis not present

## 2017-06-23 DIAGNOSIS — L600 Ingrowing nail: Secondary | ICD-10-CM | POA: Diagnosis not present

## 2017-06-23 NOTE — Progress Notes (Signed)
   Subjective:    Patient ID: Estrella Deeds, female    DOB: 1955/11/22, 62 y.o.   MRN: [id number]  HPI 62 year old female presents the office today for follow-up evaluation of right foot bunion pain.  She states that when she made the appointment she is having some discomfort but the pain has resolved.  Currently denies any pain he denies any redness, swelling, pain.  She does have a corn to the right fourth toe as well as an ingrown toenail to left big toe which is been ongoing for last several months.  Denies any redness or drainage or any swelling.  No recent treatment.  No other concerns.   Review of Systems  All other systems reviewed and are negative.      Objective:   Physical Exam General: AAO x3, NAD  Dermatological: Incurvation present the left hallux toenail along both medial lateral aspects.  There is no edema, erythema, drainage or pus there is only tenderness towards the distal portion of the nail.  There is no clinical signs of infection.  Hyperkeratotic lesion of the right fourth toe dorsally.  Upon debridement there is no underlying ulceration, drainage or any signs of infection there is no edema, erythema.  Vascular: Dorsalis Pedis artery and Posterior Tibial artery pedal pulses are 2/4 bilateral with immedate capillary fill time.There is no pain with calf compression, swelling, warmth, erythema.   Neruologic: Grossly intact via light touch bilateral.   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  Muscular strength 5/5 in all groups tested bilateral.  Gait: Unassisted, Nonantalgic.      Assessment & Plan:  62 year old female with resolved right bunion pain, left hallux ingrown toenail, right fourth digit hyperkeratotic lesion due to hammertoe deformity -Treatment options discussed including all alternatives, risks, and complications -Etiology of symptoms were discussed -At this time she is having no pain to the bunions  we will hold off on further treatment at this point.  In regards to the left hallux toenail we discussed a partial nail avulsion as well as debridement.  She like to proceed with debridement hold off on the nail avulsion.  I sharply debrided the toenail so that any complications or bleeding.  If symptoms continue she will likely need to have a partial nail avulsion she agrees with this.  I sharply debrided the hyperkeratotic lesion on the right foot without any complications or bleeding.  Offloading pads were dispensed.  Discussed changing shoes.  Trula Slade DPM

## 2017-09-12 DIAGNOSIS — R300 Dysuria: Secondary | ICD-10-CM | POA: Diagnosis not present

## 2017-09-12 DIAGNOSIS — N3000 Acute cystitis without hematuria: Secondary | ICD-10-CM | POA: Diagnosis not present

## 2017-10-04 DIAGNOSIS — D485 Neoplasm of uncertain behavior of skin: Secondary | ICD-10-CM | POA: Diagnosis not present

## 2017-10-04 DIAGNOSIS — C44729 Squamous cell carcinoma of skin of left lower limb, including hip: Secondary | ICD-10-CM | POA: Diagnosis not present

## 2017-10-11 DIAGNOSIS — J309 Allergic rhinitis, unspecified: Secondary | ICD-10-CM | POA: Diagnosis not present

## 2017-10-11 DIAGNOSIS — Z23 Encounter for immunization: Secondary | ICD-10-CM | POA: Diagnosis not present

## 2017-10-11 DIAGNOSIS — Z0000 Encounter for general adult medical examination without abnormal findings: Secondary | ICD-10-CM | POA: Diagnosis not present

## 2017-10-11 DIAGNOSIS — I10 Essential (primary) hypertension: Secondary | ICD-10-CM | POA: Diagnosis not present

## 2017-10-11 DIAGNOSIS — Z136 Encounter for screening for cardiovascular disorders: Secondary | ICD-10-CM | POA: Diagnosis not present

## 2017-11-08 DIAGNOSIS — L821 Other seborrheic keratosis: Secondary | ICD-10-CM | POA: Diagnosis not present

## 2017-11-08 DIAGNOSIS — C44729 Squamous cell carcinoma of skin of left lower limb, including hip: Secondary | ICD-10-CM | POA: Diagnosis not present

## 2017-11-08 DIAGNOSIS — L7211 Pilar cyst: Secondary | ICD-10-CM | POA: Diagnosis not present

## 2017-11-08 DIAGNOSIS — L570 Actinic keratosis: Secondary | ICD-10-CM | POA: Diagnosis not present

## 2017-11-08 DIAGNOSIS — Z85828 Personal history of other malignant neoplasm of skin: Secondary | ICD-10-CM | POA: Diagnosis not present

## 2017-11-17 DIAGNOSIS — C44729 Squamous cell carcinoma of skin of left lower limb, including hip: Secondary | ICD-10-CM | POA: Diagnosis not present

## 2018-03-28 DIAGNOSIS — Z23 Encounter for immunization: Secondary | ICD-10-CM | POA: Diagnosis not present

## 2018-03-28 DIAGNOSIS — D485 Neoplasm of uncertain behavior of skin: Secondary | ICD-10-CM | POA: Diagnosis not present

## 2018-03-28 DIAGNOSIS — C44729 Squamous cell carcinoma of skin of left lower limb, including hip: Secondary | ICD-10-CM | POA: Diagnosis not present

## 2018-04-07 ENCOUNTER — Ambulatory Visit (INDEPENDENT_AMBULATORY_CARE_PROVIDER_SITE_OTHER): Payer: BLUE CROSS/BLUE SHIELD | Admitting: Podiatry

## 2018-04-07 ENCOUNTER — Encounter: Payer: Self-pay | Admitting: Podiatry

## 2018-04-07 ENCOUNTER — Ambulatory Visit (INDEPENDENT_AMBULATORY_CARE_PROVIDER_SITE_OTHER): Payer: BLUE CROSS/BLUE SHIELD

## 2018-04-07 VITALS — BP 133/76 | HR 96 | Temp 98.2°F | Resp 14

## 2018-04-07 DIAGNOSIS — S99922A Unspecified injury of left foot, initial encounter: Secondary | ICD-10-CM | POA: Diagnosis not present

## 2018-04-07 DIAGNOSIS — S92302A Fracture of unspecified metatarsal bone(s), left foot, initial encounter for closed fracture: Secondary | ICD-10-CM

## 2018-04-08 NOTE — Progress Notes (Signed)
Subjective: 63 year old female presents healthy-appearing acute appointment.  She states that last night she was walking and she tripped on her dog's bone and she twisted her ankle she had swelling and pain to the lateral aspect of her foot she is having difficulty putting weight to the outside aspect of her foot this morning.  She had a cam boot at home that she is been wearing.  This does help some.  Describes throbbing sensation. Denies any systemic complaints such as fevers, chills, nausea, vomiting. No acute changes since last appointment, and no other complaints at this time.   Objective: AAO x3, NAD DP/PT pulses palpable bilaterally, CRT less than 3 seconds There is tenderness to palpation along the lateral aspect the left foot and there is localized edema to this area.  There is tenderness of the fifth metatarsal base as well as the fifth metatarsal neck.  Peroneal tendon appears to be intact.  Flexor, extensor tendons appear to be intact.  Achilles tendon intact.  No pain to the ankle joint.  No pain to the fibula, tibia. No open lesions or pre-ulcerative lesions.  No pain with calf compression, swelling, warmth, erythema  Assessment: Concern for fifth metatarsal neck fracture  Plan: -All treatment options discussed with the patient including all alternatives, risks, complications.  -X-rays were obtained and reviewed.  There is a radiolucency in the neck of the fifth metatarsal concerning for fracture. -At this time I want her to stay immobilized in a cam boot which she has.  Ice and elevation.  Compression anklet was dispensed. -Follow-up in 2 weeks for repeat x-rays or sooner if needed. -Patient encouraged to call the office with any questions, concerns, change in symptoms.   Trula Slade DPM

## 2018-04-10 ENCOUNTER — Other Ambulatory Visit: Payer: Self-pay | Admitting: Podiatry

## 2018-04-10 ENCOUNTER — Telehealth: Payer: Self-pay | Admitting: *Deleted

## 2018-04-10 DIAGNOSIS — C44729 Squamous cell carcinoma of skin of left lower limb, including hip: Secondary | ICD-10-CM | POA: Diagnosis not present

## 2018-04-10 MED ORDER — MELOXICAM 7.5 MG PO TABS: 7.5000 mg | ORAL_TABLET | Freq: Every day | ORAL | 0 refills | Status: DC

## 2018-04-10 NOTE — Telephone Encounter (Signed)
I informed pt Dr. Jacqualyn Posey had sent the medications to [facility].

## 2018-04-10 NOTE — Telephone Encounter (Signed)
I sent to the pharmacy listed.

## 2018-04-10 NOTE — Telephone Encounter (Signed)
Pt called states she was to get pain and swelling medication, but it is not at the pharmacy.

## 2018-04-10 NOTE — Progress Notes (Signed)
Sent mobic to pharmacy.

## 2018-04-13 DIAGNOSIS — F339 Major depressive disorder, recurrent, unspecified: Secondary | ICD-10-CM | POA: Diagnosis not present

## 2018-04-13 DIAGNOSIS — J309 Allergic rhinitis, unspecified: Secondary | ICD-10-CM | POA: Diagnosis not present

## 2018-04-13 DIAGNOSIS — I10 Essential (primary) hypertension: Secondary | ICD-10-CM | POA: Diagnosis not present

## 2018-04-17 DIAGNOSIS — S92901A Unspecified fracture of right foot, initial encounter for closed fracture: Secondary | ICD-10-CM | POA: Insufficient documentation

## 2018-04-17 DIAGNOSIS — Z1231 Encounter for screening mammogram for malignant neoplasm of breast: Secondary | ICD-10-CM | POA: Diagnosis not present

## 2018-04-17 DIAGNOSIS — Z01419 Encounter for gynecological examination (general) (routine) without abnormal findings: Secondary | ICD-10-CM | POA: Diagnosis not present

## 2018-04-17 DIAGNOSIS — M810 Age-related osteoporosis without current pathological fracture: Secondary | ICD-10-CM | POA: Diagnosis not present

## 2018-04-21 ENCOUNTER — Encounter: Payer: Self-pay | Admitting: Podiatry

## 2018-04-21 ENCOUNTER — Ambulatory Visit (INDEPENDENT_AMBULATORY_CARE_PROVIDER_SITE_OTHER): Payer: BLUE CROSS/BLUE SHIELD | Admitting: Podiatry

## 2018-04-21 ENCOUNTER — Ambulatory Visit (INDEPENDENT_AMBULATORY_CARE_PROVIDER_SITE_OTHER): Payer: BLUE CROSS/BLUE SHIELD

## 2018-04-21 ENCOUNTER — Other Ambulatory Visit: Payer: Self-pay

## 2018-04-21 DIAGNOSIS — M810 Age-related osteoporosis without current pathological fracture: Secondary | ICD-10-CM | POA: Insufficient documentation

## 2018-04-21 DIAGNOSIS — S92302A Fracture of unspecified metatarsal bone(s), left foot, initial encounter for closed fracture: Secondary | ICD-10-CM | POA: Diagnosis not present

## 2018-04-21 MED ORDER — IBUPROFEN 800 MG PO TABS: 800.0000 mg | ORAL_TABLET | Freq: Three times a day (TID) | ORAL | 0 refills | Status: AC | PRN

## 2018-04-21 NOTE — Patient Instructions (Signed)
Ibuprofen chewable tablets What is this medicine? IBUPROFEN (eye BYOO proe fen) is a non-steroidal anti-inflammatory drug (NSAID). It can relieve minor aches and pains caused by a cold, flu, sore throat, headache, or toothache. It is used to treat fever or pain for a short time. This medicine may be used for other purposes; ask your health care provider or pharmacist if you have questions. COMMON BRAND NAME(S): Advil Children's, Advil Junior Strength, Motrin Children's, Motrin Junior Strength What should I tell my health care provider before I take this medicine? They need to know if you have any of these conditions: -cigarette smoker -coronary artery bypass graft (CABG) surgery within the past 2 weeks -drink more than 3 alcohol-containing drinks a day -heart disease -high blood pressure -history of stomach bleeding -kidney disease -liver disease -lung or breathing disease, like asthma -an unusual or allergic reaction to ibuprofen, aspirin, other NSAIDs, other medicines, foods, dyes, or preservatives -pregnant or trying to get pregnant -breast-feeding How should I use this medicine? Take this medicine by mouth. Chew it completely before swallowing. Follow the directions on the package label. Read the directions on the package label very carefully. Use the child's weight or age to find the correct dose. Give with food or a drink to prevent throat burning. If this medicine upsets the stomach, give with food or milk. Do NOT give more than directed. Doses should not be given more than 4 times in one day. Talk to your pediatrician regarding the use of this medicine in children. While this drug may be prescribed for children as young as 23 years old for selected conditions, precautions do apply. Overdosage: If you think you have taken too much of this medicine contact a poison control center or emergency room at once. NOTE: This medicine is only for you. Do not share this medicine with others. What  if I miss a dose? If you miss a dose, take it as soon as you can. If it is almost time for your next dose, take only that dose. Do not take double or extra doses. What may interact with this medicine? Do not take this medicine with any of the following medications: -cidofovir -ketorolac -methotrexate -pemetrexed This medicine may also interact with the following medications: -alcohol -aspirin -diuretics -lithium -other drugs for inflammation like prednisone -warfarin This list may not describe all possible interactions. Give your health care provider a list of all the medicines, herbs, non-prescription drugs, or dietary supplements you use. Also tell them if you smoke, drink alcohol, or use illegal drugs. Some items may interact with your medicine. What should I watch for while using this medicine? Tell your doctor or healthcare professional if your symptoms do not start to get better or if they get worse. Call your doctor if your symptoms do not start to get better within 1 day or if they get worse. Also, check with your doctor if a fever or pain lasts for more than 3 days. See a doctor if you have redness, swelling or pus in the painful area. This medicine does not prevent heart attack or stroke. In fact, this medicine may increase the chance of a heart attack or stroke. The chance may increase with longer use of this medicine and in people who have heart disease. If you take aspirin to prevent heart attack or stroke, talk with your doctor or health care professional. Do not take other medicines that contain aspirin, ibuprofen, or naproxen with this medicine. Side effects such as stomach upset, nausea,  or ulcers may be more likely to occur. Many medicines available without a prescription should not be taken with this medicine. This medicine can cause ulcers and bleeding in the stomach and intestines at any time during treatment. Ulcers and bleeding can happen without warning symptoms and can  cause death. To reduce your risk, do not smoke cigarettes or drink alcohol while you are taking this medicine. This medicine can cause you to bleed more easily. Try to avoid damage to your teeth and gums when you brush or floss your teeth. This medicine may be used to treat migraines. If you take migraine medicines for 10 or more days a month, your migraines may get worse. Keep a diary of headache days and medicine use. Contact your healthcare professional if your migraine attacks occur more frequently. What side effects may I notice from receiving this medicine? Side effects that you should report to your doctor or health care professional as soon as possible: -allergic reactions like skin rash, itching or hives, swelling of the face, lips, or tongue -severe stomach pain -signs and symptoms of bleeding such as bloody or black, tarry stools; red or dark-brown urine; spitting up blood or brown material that looks like coffee grounds; red spots on the skin; unusual bruising or bleeding from the eye, gums, or nose -signs and symptoms of a blood clot such as changes in vision; chest pain; severe, sudden headache; trouble speaking; sudden numbness or weakness of the face, arm, or leg -unexplained weight gain or swelling -unusually weak or tired -yellowing of eyes or skin Side effects that usually do not require medical attention (report to your doctor or health care professional if they continue or are bothersome): -bruising -diarrhea -dizziness, drowsiness -headache -nausea, vomiting This list may not describe all possible side effects. Call your doctor for medical advice about side effects. You may report side effects to FDA at 1-800-FDA-1088. Where should I keep my medicine? Keep out of the reach of children. Store at room temperature between 20 to 25 degrees C (68 to 77 degrees F). Keep container tightly closed. Throw away any unused medicine after the expiration date. NOTE: This sheet is a  summary. It may not cover all possible information. If you have questions about this medicine, talk to your doctor, pharmacist, or health care provider.  2019 Elsevier/Gold Standard (2016-09-29 11:58:44)

## 2018-04-24 DIAGNOSIS — C44729 Squamous cell carcinoma of skin of left lower limb, including hip: Secondary | ICD-10-CM | POA: Diagnosis not present

## 2018-04-30 NOTE — Progress Notes (Signed)
Subjective: 63 year old female presents the office today for follow-up evaluation of concern for fifth metatarsal neck fracture.  She states that she still gets discomfort and still has some swelling.  The boot is not been that helpful.  Denies any recent injury or falls otherwise. Denies any systemic complaints such as fevers, chills, nausea, vomiting. No acute changes since last appointment, and no other complaints at this time.   Objective: AAO x3, NAD DP/PT pulses palpable bilaterally, CRT less than 3 seconds There is still tenderness palpation along the left foot mostly on the lateral fifth metatarsal area.  There is also still swelling to this area there is no erythema or warmth.  Flexor, extensor tendons appear to be intact. No open lesions or pre-ulcerative lesions.  No pain with calf compression, swelling, warmth, erythema  Assessment: Continued pain and concern for metatarsal fracture LEFT foot  Plan: -All treatment options discussed with the patient including all alternatives, risks, complications.  -Repeat x-rays were obtained reviewed.  There is radiolucency in the fifth metatarsal neck concerning for fracture.  Present clinically where she gets pain as well.  Due to this monitoring in the cam boot.  Ice and elevation.  Prescribed ibuprofen 800 mg 3 times daily as needed. -Patient encouraged to call the office with any questions, concerns, change in symptoms.   Trula Slade DPM

## 2018-05-05 ENCOUNTER — Other Ambulatory Visit: Payer: Self-pay

## 2018-05-05 ENCOUNTER — Ambulatory Visit (INDEPENDENT_AMBULATORY_CARE_PROVIDER_SITE_OTHER): Payer: BLUE CROSS/BLUE SHIELD

## 2018-05-05 ENCOUNTER — Encounter: Payer: Self-pay | Admitting: *Deleted

## 2018-05-05 ENCOUNTER — Encounter: Payer: Self-pay | Admitting: Podiatry

## 2018-05-05 ENCOUNTER — Ambulatory Visit (INDEPENDENT_AMBULATORY_CARE_PROVIDER_SITE_OTHER): Payer: BLUE CROSS/BLUE SHIELD | Admitting: Podiatry

## 2018-05-05 VITALS — Temp 97.7°F

## 2018-05-05 DIAGNOSIS — S92302A Fracture of unspecified metatarsal bone(s), left foot, initial encounter for closed fracture: Secondary | ICD-10-CM

## 2018-05-05 NOTE — Progress Notes (Unsigned)
Patient has been screened for COVID/19 today.

## 2018-05-08 DIAGNOSIS — C44729 Squamous cell carcinoma of skin of left lower limb, including hip: Secondary | ICD-10-CM | POA: Diagnosis not present

## 2018-05-09 NOTE — Progress Notes (Signed)
Subjective: 63 year old female presents the office today for follow-up evaluation and concern for left fifth metatarsal neck fracture.  Overall she states that she is doing much better she is back to wearing regular shoes to having no pain or swelling and she has no concerns. Denies any systemic complaints such as fevers, chills, nausea, vomiting. No acute changes since last appointment, and no other complaints at this time.   Objective: AAO x3, NAD DP/PT pulses palpable bilaterally, CRT less than 3 seconds At this time there is no area of tenderness elicited and there is no erythema point tenderness.  There is no edema, erythema, increase in warmth.  She has no concerns today. No open lesions or pre-ulcerative lesions.  No pain with calf compression, swelling, warmth, erythema  Assessment: Resolved left foot pain  Plan: -All treatment options discussed with the patient including all alternatives, risks, complications.  -X-rays were obtained reviewed.  No evidence of acute fracture or stress fracture.  The area of radiolucency that was concerning has improved as well. -She is back to a regular shoe without having any pain or issues.  Continue with this.  This plan will see her back as needed.  If there is any recurrence of pain let me know.  Gradually increase to activity level. -Patient encouraged to call the office with any questions, concerns, change in symptoms.   Trula Slade DPM

## 2018-05-22 DIAGNOSIS — C44729 Squamous cell carcinoma of skin of left lower limb, including hip: Secondary | ICD-10-CM | POA: Diagnosis not present

## 2018-06-14 ENCOUNTER — Telehealth: Payer: Self-pay | Admitting: *Deleted

## 2018-06-14 NOTE — Telephone Encounter (Signed)
Pt asked if she could get an injection like she did in her bunion.

## 2018-06-15 NOTE — Telephone Encounter (Signed)
Pt scheduled for Monday at 11:15 am with Dr. Jacqualyn Posey.

## 2018-06-19 ENCOUNTER — Ambulatory Visit (INDEPENDENT_AMBULATORY_CARE_PROVIDER_SITE_OTHER): Payer: BLUE CROSS/BLUE SHIELD | Admitting: Podiatry

## 2018-06-19 ENCOUNTER — Other Ambulatory Visit: Payer: Self-pay

## 2018-06-19 VITALS — Temp 97.9°F

## 2018-06-19 DIAGNOSIS — M7750 Other enthesopathy of unspecified foot: Secondary | ICD-10-CM

## 2018-06-19 DIAGNOSIS — M7752 Other enthesopathy of left foot: Secondary | ICD-10-CM

## 2018-06-19 DIAGNOSIS — L720 Epidermal cyst: Secondary | ICD-10-CM | POA: Diagnosis not present

## 2018-06-19 DIAGNOSIS — D485 Neoplasm of uncertain behavior of skin: Secondary | ICD-10-CM | POA: Diagnosis not present

## 2018-06-19 NOTE — Progress Notes (Signed)
Subjective: 63 year old female presents the office today requesting a steroid injection to the left foot.  She states after I last saw her she did seem occasional achy discomfort to the lateral aspect the foot.  She was proceed with injection today.  She is able to wear regular shoes when significant improvement no increase in swelling and no redness.  No recent injury or trauma since I last saw her. Denies any systemic complaints such as fevers, chills, nausea, vomiting. No acute changes since last appointment, and no other complaints at this time.   Objective: AAO x3, NAD DP/PT pulses palpable bilaterally, CRT less than 3 seconds There is tenderness palpation to the fifth metatarsal base on the lateral aspect the left foot.  No pain the dorsal metatarsal also is to be localized to the fifth metatarsal base.  No pain on the peroneal tendon.  Peroneal tendon appears to be intact.  There is no other areas of tenderness elicited. No open lesions or pre-ulcerative lesions.  No pain with calf compression, swelling, warmth, erythema  Assessment: Insertion peroneal tendonitis, left   Plan: -All treatment options discussed with the patient including all alternatives, risks, complications.  -Today steroid dressing was applied to the area.  A mixture of quarter cc Kenalog 10, quarter cc of dexamethasone phosphate and 0.5 cc of Marcaine plain was infiltrated into the fifth metatarsal base laterally without any complications.  Postinjection care was discussed.  She tolerated well.  I want her to wear the cam boot next couple of days to immobilize the peroneal tendon.  Ice to the area as well.  Discussed shoe modifications. -Patient encouraged to call the office with any questions, concerns, change in symptoms.   Trula Slade DPM

## 2018-08-07 DIAGNOSIS — B999 Unspecified infectious disease: Secondary | ICD-10-CM | POA: Diagnosis not present

## 2018-09-19 DIAGNOSIS — K1329 Other disturbances of oral epithelium, including tongue: Secondary | ICD-10-CM | POA: Diagnosis not present

## 2018-10-02 DIAGNOSIS — B999 Unspecified infectious disease: Secondary | ICD-10-CM | POA: Diagnosis not present

## 2018-10-19 DIAGNOSIS — J309 Allergic rhinitis, unspecified: Secondary | ICD-10-CM | POA: Diagnosis not present

## 2018-10-19 DIAGNOSIS — I10 Essential (primary) hypertension: Secondary | ICD-10-CM | POA: Diagnosis not present

## 2018-10-19 DIAGNOSIS — F339 Major depressive disorder, recurrent, unspecified: Secondary | ICD-10-CM | POA: Diagnosis not present

## 2018-10-19 DIAGNOSIS — Z0000 Encounter for general adult medical examination without abnormal findings: Secondary | ICD-10-CM | POA: Diagnosis not present

## 2018-10-23 DIAGNOSIS — B999 Unspecified infectious disease: Secondary | ICD-10-CM | POA: Diagnosis not present

## 2018-10-30 DIAGNOSIS — T148XXA Other injury of unspecified body region, initial encounter: Secondary | ICD-10-CM | POA: Diagnosis not present

## 2018-11-01 DIAGNOSIS — I10 Essential (primary) hypertension: Secondary | ICD-10-CM | POA: Diagnosis not present

## 2018-11-01 DIAGNOSIS — Z13220 Encounter for screening for lipoid disorders: Secondary | ICD-10-CM | POA: Diagnosis not present

## 2018-11-01 DIAGNOSIS — Z23 Encounter for immunization: Secondary | ICD-10-CM | POA: Diagnosis not present

## 2018-11-08 DIAGNOSIS — S81802D Unspecified open wound, left lower leg, subsequent encounter: Secondary | ICD-10-CM | POA: Diagnosis not present

## 2018-11-16 DIAGNOSIS — L853 Xerosis cutis: Secondary | ICD-10-CM | POA: Diagnosis not present

## 2018-11-16 DIAGNOSIS — L821 Other seborrheic keratosis: Secondary | ICD-10-CM | POA: Diagnosis not present

## 2018-11-16 DIAGNOSIS — L719 Rosacea, unspecified: Secondary | ICD-10-CM | POA: Diagnosis not present

## 2018-11-16 DIAGNOSIS — Z85828 Personal history of other malignant neoplasm of skin: Secondary | ICD-10-CM | POA: Diagnosis not present

## 2018-11-29 DIAGNOSIS — D485 Neoplasm of uncertain behavior of skin: Secondary | ICD-10-CM | POA: Diagnosis not present

## 2018-11-29 DIAGNOSIS — L989 Disorder of the skin and subcutaneous tissue, unspecified: Secondary | ICD-10-CM | POA: Diagnosis not present

## 2018-12-05 ENCOUNTER — Other Ambulatory Visit: Payer: Self-pay

## 2018-12-05 ENCOUNTER — Emergency Department (HOSPITAL_COMMUNITY): Payer: BC Managed Care – PPO

## 2018-12-05 ENCOUNTER — Emergency Department (HOSPITAL_COMMUNITY)
Admission: EM | Admit: 2018-12-05 | Discharge: 2018-12-05 | Disposition: A | Discharge status: Home / Self Care | Payer: BC Managed Care – PPO | Attending: Emergency Medicine | Admitting: Emergency Medicine

## 2018-12-05 ENCOUNTER — Encounter (HOSPITAL_COMMUNITY): Payer: Self-pay | Admitting: Emergency Medicine

## 2018-12-05 DIAGNOSIS — S32591A Other specified fracture of right pubis, initial encounter for closed fracture: Secondary | ICD-10-CM | POA: Insufficient documentation

## 2018-12-05 DIAGNOSIS — Y92009 Unspecified place in unspecified non-institutional (private) residence as the place of occurrence of the external cause: Secondary | ICD-10-CM | POA: Diagnosis not present

## 2018-12-05 DIAGNOSIS — S79911A Unspecified injury of right hip, initial encounter: Secondary | ICD-10-CM | POA: Diagnosis not present

## 2018-12-05 DIAGNOSIS — R52 Pain, unspecified: Secondary | ICD-10-CM | POA: Diagnosis not present

## 2018-12-05 DIAGNOSIS — W010XXA Fall on same level from slipping, tripping and stumbling without subsequent striking against object, initial encounter: Secondary | ICD-10-CM | POA: Insufficient documentation

## 2018-12-05 DIAGNOSIS — M25572 Pain in left ankle and joints of left foot: Secondary | ICD-10-CM | POA: Diagnosis not present

## 2018-12-05 DIAGNOSIS — W19XXXA Unspecified fall, initial encounter: Secondary | ICD-10-CM | POA: Diagnosis not present

## 2018-12-05 DIAGNOSIS — Y999 Unspecified external cause status: Secondary | ICD-10-CM | POA: Insufficient documentation

## 2018-12-05 DIAGNOSIS — M25551 Pain in right hip: Secondary | ICD-10-CM | POA: Diagnosis not present

## 2018-12-05 DIAGNOSIS — Z79899 Other long term (current) drug therapy: Secondary | ICD-10-CM | POA: Diagnosis not present

## 2018-12-05 DIAGNOSIS — Y939 Activity, unspecified: Secondary | ICD-10-CM | POA: Insufficient documentation

## 2018-12-05 DIAGNOSIS — I10 Essential (primary) hypertension: Secondary | ICD-10-CM | POA: Insufficient documentation

## 2018-12-05 DIAGNOSIS — Z87891 Personal history of nicotine dependence: Secondary | ICD-10-CM | POA: Insufficient documentation

## 2018-12-05 DIAGNOSIS — S32511A Fracture of superior rim of right pubis, initial encounter for closed fracture: Secondary | ICD-10-CM | POA: Diagnosis not present

## 2018-12-05 MED ORDER — OXYCODONE-ACETAMINOPHEN 5-325 MG PO TABS
1.0000 | ORAL_TABLET | Freq: Once | ORAL | Status: AC
  Administered 2018-12-05: 1 via ORAL
  Filled 2018-12-05: qty 1

## 2018-12-05 MED ORDER — ONDANSETRON HCL 4 MG/2ML IJ SOLN
4.0000 mg | Freq: Once | INTRAMUSCULAR | Status: AC
  Administered 2018-12-05: 4 mg via INTRAVENOUS
  Filled 2018-12-05: qty 2

## 2018-12-05 MED ORDER — ONDANSETRON 4 MG PO TBDP: 4.0000 mg | ORAL_TABLET | Freq: Once | ORAL | Status: DC

## 2018-12-05 MED ORDER — ONDANSETRON 4 MG PO TBDP: 4.0000 mg | ORAL_TABLET | Freq: Three times a day (TID) | ORAL | 0 refills | Status: DC | PRN

## 2018-12-05 MED ORDER — OXYCODONE HCL 5 MG PO TABS: 5.0000 mg | ORAL_TABLET | Freq: Once | ORAL | Status: DC

## 2018-12-05 MED ORDER — MORPHINE SULFATE (PF) 4 MG/ML IV SOLN
4.0000 mg | Freq: Once | INTRAVENOUS | Status: AC
  Administered 2018-12-05: 4 mg via INTRAVENOUS
  Filled 2018-12-05: qty 1

## 2018-12-05 MED ORDER — HYDROMORPHONE HCL 1 MG/ML IJ SOLN
0.5000 mg | Freq: Once | INTRAMUSCULAR | Status: AC
  Administered 2018-12-05: 0.5 mg via INTRAVENOUS
  Filled 2018-12-05: qty 1

## 2018-12-05 MED ORDER — OXYCODONE-ACETAMINOPHEN 5-325 MG PO TABS: 2.0000 | ORAL_TABLET | Freq: Four times a day (QID) | ORAL | 0 refills | Status: DC | PRN

## 2018-12-05 NOTE — Discharge Instructions (Signed)
You can take Tylenol or Ibuprofen as directed for pain. You can alternate Tylenol and Ibuprofen every 4 hours. If you take Tylenol at 1pm, then you can take Ibuprofen at 5pm. Then you can take Tylenol again at 9pm.   You should not exceed 4000 mg of Tylenol a day.  He should not exceed 1200 mg of ibuprofen a day.  Take pain medications as directed for break through pain. Do not drive or operate machinery while taking this medication.  Take Zofran as needed for nausea/vomiting.  As we discussed, your weightbearing as tolerated.  You should rest and relax as much as possible but you can walk with the assistance of a walker.  As we discussed, you will need orthopedic follow-up.  You can either call Dr. Alma Friendly or [facility] Anna.  Call their office tomorrow and arrange for an outpatient follow-up appointment.  Return the emergency department for any worsening pain, discoloration of the leg, numbness/weakness in the leg, fevers or any other worsening or concerning symptoms.

## 2018-12-05 NOTE — ED Triage Notes (Signed)
Per ems, pt from home. Pt had a fall last night out of bed and hit the floor. Pt reports rt hip pain. Pt reports pain when bearing weight. No obvious deformity or rotation. No other injuries from fall. Not on blood thinners. Hx of osteoporosis. VSS.

## 2018-12-05 NOTE — ED Provider Notes (Signed)
[facility] EMERGENCY DEPARTMENT Provider Note   CSN: [id number] Arrival date & time: 12/05/18  1035     History   Chief Complaint Chief Complaint  Patient presents with  . Fall  . Hip Pain    HPI Vanessa Patel is a 63 y.o. female with PMH/o GERD, HTN who presents for evaluation of right sided hip pain s/p mechanical fall that occurred at 9pm last night.  Patient reports that she fell last night at her house.  She is unsure of exactly what happened but she thinks that she slipped on some water and fell landing on the right hip.  She did not hit her head and denies any LOC.  She is not on blood thinner.  Patient states that since then, she has had pain and difficulty ambulating bearing weight on her right lower extremity.  She also reports pain with movement of the right lower extremity.  She took some over-the-counter pain medication at home but none since.  She has had some nausea but denies any vomiting.  She denies any neck pain or back pain, chest pain, dizziness, abdominal pain, numbness/weakness.     The history is provided by the patient.    Past Medical History:  Diagnosis Date  . Allergy   . Arthritis   . Depression   . Environmental allergies   . GERD (gastroesophageal reflux disease)   . Hypertension   . Osteoporosis   . Seasonal allergies   . Wears glasses     Patient Active Problem List   Diagnosis Date Noted  . Osteoporosis 04/21/2018  . Closed fracture of right foot 04/17/2018    Past Surgical History:  Procedure Laterality Date  . APPENDECTOMY    . COLONOSCOPY    . HARDWARE REMOVAL  06/08/2011   Procedure: HARDWARE REMOVAL;  Surgeon: Tennis Must, MD;  Location: [place];  Service: Orthopedics;  Laterality: Left;  FROM LEFT WRIST  . LAPAROSCOPIC ENDOMETRIOSIS FULGURATION     years ago   . left wrist     2007 left wrist plate fracture  . left wrist fracture surgery  2013   this is second break froma fall  . MASS  EXCISION Left 05/14/2013   Procedure: EXCISION MASS LEFT LONG FINGER;  Surgeon: Tennis Must, MD;  Location: [place];  Service: Orthopedics;  Laterality: Left;  . POLYPECTOMY    . TENDON REPAIR  06/08/2011   Procedure: TENDON REPAIR;  Surgeon: Tennis Must, MD;  Location: [place];  Service: Orthopedics;  Laterality: Left;  LEFT FPL RECONSTRUCTION      OB History   No obstetric history on file.      Home Medications    Prior to Admission medications   Medication Sig Start Date End Date Taking? Authorizing Provider  alendronate (FOSAMAX) 70 MG tablet Take 70 mg by mouth once a week. Take with a full glass of water on an empty stomach.    [provider]  amLODipine (NORVASC) 5 MG tablet Take 5 mg by mouth daily.    [provider]  amoxicillin-clavulanate (AUGMENTIN) 875-125 MG tablet amoxicillin 875 mg-potassium clavulanate 125 mg tablet    [provider]  Calcium Carbonate-Vitamin D (CALCIUM 500 + D) 500-125 MG-UNIT TABS Take by mouth 2 (two) times daily.      [provider]  cephALEXin (KEFLEX) 500 MG capsule  01/07/18   [provider]  cetirizine (ZYRTEC) 10 MG tablet Take  10 mg by mouth daily.    [provider]  FLUoxetine (PROZAC) 20 MG capsule  10/17/10   [provider]  halobetasol (ULTRAVATE) 0.05 % ointment halobetasol propionate 0.05 % topical ointment    [provider]  hydrochlorothiazide (HYDRODIURIL) 25 MG tablet  09/27/10   [provider]  HYDROcodone-acetaminophen (NORCO/VICODIN) 5-325 MG tablet hydrocodone 5 mg-acetaminophen 325 mg tablet    [provider]  hydrocortisone (PROCTOZONE-HC) 2.5 % rectal cream Proctozone-HC 2.5 % topical cream perineal applicator    [provider]  ibuprofen (ADVIL,MOTRIN) 800 MG tablet Take 1 tablet (800 mg total) by mouth every 8 (eight) hours as needed. 04/21/18   Trula Slade, DPM  levofloxacin  (LEVAQUIN) 750 MG tablet levofloxacin 750 mg tablet    [provider]  lisinopril (PRINIVIL,ZESTRIL) 10 MG tablet Take 10 mg by mouth daily.    [provider]  loratadine-pseudoephedrine (CLARITIN-D 24-HOUR) 10-240 MG 24 hr tablet Take 1 tablet daily by mouth.    [provider]  meloxicam (MOBIC) 7.5 MG tablet Take 1 tablet (7.5 mg total) by mouth daily. 04/10/18   Trula Slade, DPM  metoprolol succinate (TOPROL-XL) 25 MG 24 hr tablet metoprolol succinate ER 25 mg tablet,extended release 24 hr    [provider]  montelukast (SINGULAIR) 10 MG tablet Take 10 mg by mouth at bedtime.    [provider]  mupirocin ointment (BACTROBAN) 2 %  01/07/18   [provider]  naproxen (NAPROSYN) 500 MG tablet naproxen 500 mg tablet    [provider]  nebivolol (BYSTOLIC) 10 MG tablet Bystolic 10 mg tablet    [provider]  Omeprazole (PRILOSEC PO) Prilosec    [provider]  omeprazole (PRILOSEC) 20 MG capsule Take 20 mg by mouth daily.      [provider]  ondansetron (ZOFRAN ODT) 4 MG disintegrating tablet Take 1 tablet (4 mg total) by mouth every 8 (eight) hours as needed for nausea or vomiting. 12/05/18   Volanda Napoleon, PA-C  oxyCODONE-acetaminophen (PERCOCET/ROXICET) 5-325 MG tablet Take 2 tablets by mouth every 6 (six) hours as needed for severe pain. 12/05/18   Volanda Napoleon, PA-C  potassium chloride (K-DUR) 10 MEQ tablet potassium chloride ER 10 mEq tablet,extended release(part/cryst)    [provider]  sulfamethoxazole-trimethoprim (BACTRIM DS) 800-160 MG tablet sulfamethoxazole 800 mg-trimethoprim 160 mg tablet    [provider]    Family History Family History  Problem Relation Age of Onset  . Colon polyps Mother   . Colon cancer Neg Hx   . Esophageal cancer Neg Hx   . Rectal cancer Neg Hx   . Stomach cancer Neg Hx   . Pancreatic cancer Neg Hx   . Breast cancer Neg  Hx     Social History Social History   Tobacco Use  . Smoking status: Former Smoker    Quit date: 11/09/1990    Years since quitting: 28.0  . Smokeless tobacco: Never Used  Substance Use Topics  . Alcohol use: Yes    Alcohol/week: 3.0 standard drinks    Types: 3 drink(s) per week  . Drug use: No     Allergies   Patient has no known allergies.   Review of Systems Review of Systems  Eyes: Negative for visual disturbance.  Gastrointestinal: Positive for nausea. Negative for abdominal pain and vomiting.  Musculoskeletal: Negative for back pain and neck pain.       Right hip pain  Neurological:  Negative for weakness and numbness.  All other systems reviewed and are negative.    Physical Exam Updated Vital Signs BP 123/73 (BP Location: Right Arm)   Pulse 90   Temp 98.4 F (36.9 C) (Oral)   Resp 18   Ht 5\' 2"  (1.575 m)   Wt 59 kg   SpO2 98%   BMI 23.78 kg/m   Physical Exam Vitals signs and nursing note reviewed.  Constitutional:      Appearance: Normal appearance. She is well-developed.  HENT:     Head: Normocephalic and atraumatic.  Eyes:     General: Lids are normal.     Conjunctiva/sclera: Conjunctivae normal.     Pupils: Pupils are equal, round, and reactive to light.  Neck:     Musculoskeletal: Full passive range of motion without pain.  Cardiovascular:     Rate and Rhythm: Normal rate and regular rhythm.     Pulses: Normal pulses.          Dorsalis pedis pulses are 2+ on the right side and 2+ on the left side.     Heart sounds: Normal heart sounds. No murmur. No friction rub. No gallop.   Pulmonary:     Effort: Pulmonary effort is normal.     Breath sounds: Normal breath sounds.     Comments: Lungs clear to auscultation bilaterally.  Symmetric chest rise.  No wheezing, rales, rhonchi. Abdominal:     Palpations: Abdomen is soft. Abdomen is not rigid.     Tenderness: There is no abdominal tenderness. There is no guarding.  Musculoskeletal: Normal  range of motion.     Comments: Tenderness to palpation noted to the right right hip.  No deformity crepitus noted.  Limited range of motion secondary to pain.  No bony tenderness noted to distal femur, knee, tib-fib, ankle.  She can flex and extend the knee without any difficulty.  No tenderness palpation of the left lower extremity.  Full range of motion of left lower extremity intact with any difficulty.  Skin:    General: Skin is warm and dry.     Capillary Refill: Capillary refill takes less than 2 seconds.  Neurological:     Mental Status: She is alert and oriented to person, place, and time.     Comments: Sensation intact along major nerve distributions of BLE  Psychiatric:        Speech: Speech normal.      ED Treatments / Results  Labs (all labs ordered are listed, but only abnormal results are displayed) Labs Reviewed - No data to display  EKG None  Radiology Dg Hip Unilat  With Pelvis 2-3 Views Right  Result Date: 12/05/2018 CLINICAL DATA:  Fall, right hip pain EXAM: DG HIP (WITH OR WITHOUT PELVIS) 2-3V RIGHT COMPARISON:  None. FINDINGS: Fractures are noted through the superior and inferior pubic rami on the right. No proximal femoral fracture. No subluxation or dislocation. SI joints symmetric and unremarkable. IMPRESSION: Fractures through the right superior and inferior pubic rami. Electronically Signed   By: Rolm Baptise M.D.   On: 12/05/2018 11:38    Procedures Procedures (including critical care time)  Medications Ordered in ED Medications  ondansetron (ZOFRAN) injection 4 mg (4 mg Intravenous Given 12/05/18 1530)  morphine 4 MG/ML injection 4 mg (4 mg Intravenous Given 12/05/18 1530)  HYDROmorphone (DILAUDID) injection 0.5 mg (0.5 mg Intravenous Given 12/05/18 1801)  ondansetron (ZOFRAN) injection 4 mg (4 mg Intravenous Given 12/05/18 1801)  oxyCODONE-acetaminophen (PERCOCET/ROXICET) 5-325 MG  per tablet 1 tablet (1 tablet Oral Given 12/05/18 1847)      Initial Impression / Assessment and Plan / ED Course  I have reviewed the triage vital signs and the nursing notes.  Pertinent labs & imaging results that were available during my care of the patient were reviewed by me and considered in my medical decision making (see chart for details).        63 year old female who presents for evaluation of right hip pain status post mechanical fall that occurred yesterday.  She thinks she may have slipped on some water and landed on her right hip.  No head injury, LOC.  No preceding chest pain or dizziness.  Difficulty ambulating and bearing weight since the incident. Patient is afebrile, non-toxic appearing, sitting comfortably on examination table. Vital signs reviewed and stable.  On exam, she has tenderness palpation of the right hip as well as some limited range of motion.  Patient is neurovascularly intact.  X-rays ordered at triage.  X-rays reviewed.  There is a right superior and inferior pubic rami fracture.  Will give analgesics and discussed with Ortho.  Discussed with Hilbert Odor, PA-C (Ortho).  Recommends weightbearing as tolerated with either crutches or walker.  Patient can follow-up with outpatient Ortho.  Evaluation of her analgesics.  Patient reports some improvement in pain.  She was able to ambulate to the bathroom with the assistance of a walker and a tech.  I discussed admission versus discharge with patient I did offer admission for pain control.  Patient reports improvement after analgesics here in the ED.  She is hemodynamically stable and is neurovascularly intact.  Her preference would be to go home with pain medication and a walker.  Feel that this is reasonable given her reassuring exam.  I discussed with patient that she needs close outpatient orthopedic follow-up.  She has seen Weston Anna before.  Instructed her to call and arrange for outpatient appointment.  Patient provided with a walker here in the ED. At this time,  patient exhibits no emergent life-threatening condition that require further evaluation in ED or admission. Patient had ample opportunity for questions and discussion. All patient's questions were answered with full understanding. Strict return precautions discussed. Patient expresses understanding and agreement to plan.   Portions of this note were generated with [employer]. Dictation errors may occur despite best attempts at proofreading.   Final Clinical Impressions(s) / ED Diagnoses   Final diagnoses:  Closed fracture of ramus of right pubis, initial encounter [facility])    ED Discharge Orders         Ordered    ondansetron (ZOFRAN ODT) 4 MG disintegrating tablet  Every 8 hours PRN     12/05/18 1842    oxyCODONE-acetaminophen (PERCOCET/ROXICET) 5-325 MG tablet  Every 6 hours PRN     12/05/18 1842           Volanda Napoleon, PA-C 12/05/18 1939    Tegeler, Gwenyth Allegra, MD 12/06/18 0111

## 2018-12-05 NOTE — ED Notes (Signed)
Patient verbalizes understanding of discharge instructions. Opportunity for questioning and answers were provided. Armband removed by staff, pt discharged from ED.  

## 2018-12-09 ENCOUNTER — Telehealth: Payer: Self-pay | Admitting: Surgery

## 2018-12-11 DIAGNOSIS — S32591A Other specified fracture of right pubis, initial encounter for closed fracture: Secondary | ICD-10-CM | POA: Diagnosis not present

## 2019-01-08 DIAGNOSIS — L989 Disorder of the skin and subcutaneous tissue, unspecified: Secondary | ICD-10-CM | POA: Diagnosis not present

## 2019-01-08 DIAGNOSIS — D485 Neoplasm of uncertain behavior of skin: Secondary | ICD-10-CM | POA: Diagnosis not present

## 2019-01-09 DIAGNOSIS — S32591D Other specified fracture of right pubis, subsequent encounter for fracture with routine healing: Secondary | ICD-10-CM | POA: Diagnosis not present

## 2019-01-23 DIAGNOSIS — S81802D Unspecified open wound, left lower leg, subsequent encounter: Secondary | ICD-10-CM | POA: Diagnosis not present

## 2019-02-12 DIAGNOSIS — S32591D Other specified fracture of right pubis, subsequent encounter for fracture with routine healing: Secondary | ICD-10-CM | POA: Diagnosis not present

## 2019-04-20 DIAGNOSIS — F339 Major depressive disorder, recurrent, unspecified: Secondary | ICD-10-CM | POA: Diagnosis not present

## 2019-04-20 DIAGNOSIS — M810 Age-related osteoporosis without current pathological fracture: Secondary | ICD-10-CM | POA: Diagnosis not present

## 2019-04-20 DIAGNOSIS — J309 Allergic rhinitis, unspecified: Secondary | ICD-10-CM | POA: Diagnosis not present

## 2019-04-20 DIAGNOSIS — I10 Essential (primary) hypertension: Secondary | ICD-10-CM | POA: Diagnosis not present

## 2019-05-15 DIAGNOSIS — E876 Hypokalemia: Secondary | ICD-10-CM | POA: Diagnosis not present

## 2019-10-30 DIAGNOSIS — I10 Essential (primary) hypertension: Secondary | ICD-10-CM | POA: Diagnosis not present

## 2019-10-30 DIAGNOSIS — Z23 Encounter for immunization: Secondary | ICD-10-CM | POA: Diagnosis not present

## 2019-10-30 DIAGNOSIS — J309 Allergic rhinitis, unspecified: Secondary | ICD-10-CM | POA: Diagnosis not present

## 2019-10-30 DIAGNOSIS — Z0000 Encounter for general adult medical examination without abnormal findings: Secondary | ICD-10-CM | POA: Diagnosis not present

## 2019-10-30 DIAGNOSIS — M810 Age-related osteoporosis without current pathological fracture: Secondary | ICD-10-CM | POA: Diagnosis not present

## 2019-11-20 DIAGNOSIS — L853 Xerosis cutis: Secondary | ICD-10-CM | POA: Diagnosis not present

## 2019-11-20 DIAGNOSIS — D692 Other nonthrombocytopenic purpura: Secondary | ICD-10-CM | POA: Diagnosis not present

## 2019-11-20 DIAGNOSIS — Z85828 Personal history of other malignant neoplasm of skin: Secondary | ICD-10-CM | POA: Diagnosis not present

## 2019-11-20 DIAGNOSIS — L821 Other seborrheic keratosis: Secondary | ICD-10-CM | POA: Diagnosis not present

## 2019-11-20 DIAGNOSIS — L570 Actinic keratosis: Secondary | ICD-10-CM | POA: Diagnosis not present

## 2019-12-17 DIAGNOSIS — S335XXA Sprain of ligaments of lumbar spine, initial encounter: Secondary | ICD-10-CM | POA: Diagnosis not present

## 2019-12-17 DIAGNOSIS — M25551 Pain in right hip: Secondary | ICD-10-CM | POA: Diagnosis not present

## 2020-01-10 DIAGNOSIS — Z6824 Body mass index (BMI) 24.0-24.9, adult: Secondary | ICD-10-CM | POA: Diagnosis not present

## 2020-01-10 DIAGNOSIS — Z01419 Encounter for gynecological examination (general) (routine) without abnormal findings: Secondary | ICD-10-CM | POA: Diagnosis not present

## 2020-01-10 DIAGNOSIS — Z1231 Encounter for screening mammogram for malignant neoplasm of breast: Secondary | ICD-10-CM | POA: Diagnosis not present

## 2020-01-10 DIAGNOSIS — M810 Age-related osteoporosis without current pathological fracture: Secondary | ICD-10-CM | POA: Diagnosis not present

## 2020-01-14 DIAGNOSIS — M810 Age-related osteoporosis without current pathological fracture: Secondary | ICD-10-CM | POA: Diagnosis not present

## 2020-01-15 DIAGNOSIS — M545 Low back pain, unspecified: Secondary | ICD-10-CM | POA: Diagnosis not present

## 2020-01-17 DIAGNOSIS — M5416 Radiculopathy, lumbar region: Secondary | ICD-10-CM | POA: Diagnosis not present

## 2020-01-25 DIAGNOSIS — Z01818 Encounter for other preprocedural examination: Secondary | ICD-10-CM | POA: Diagnosis not present

## 2020-01-25 DIAGNOSIS — H2511 Age-related nuclear cataract, right eye: Secondary | ICD-10-CM | POA: Diagnosis not present

## 2020-01-30 DIAGNOSIS — M5416 Radiculopathy, lumbar region: Secondary | ICD-10-CM | POA: Diagnosis not present

## 2020-02-13 DIAGNOSIS — H2511 Age-related nuclear cataract, right eye: Secondary | ICD-10-CM | POA: Diagnosis not present

## 2020-02-18 DIAGNOSIS — M5416 Radiculopathy, lumbar region: Secondary | ICD-10-CM | POA: Diagnosis not present

## 2020-02-20 DIAGNOSIS — D485 Neoplasm of uncertain behavior of skin: Secondary | ICD-10-CM | POA: Diagnosis not present

## 2020-02-20 DIAGNOSIS — L308 Other specified dermatitis: Secondary | ICD-10-CM | POA: Diagnosis not present

## 2020-02-20 DIAGNOSIS — Z872 Personal history of diseases of the skin and subcutaneous tissue: Secondary | ICD-10-CM | POA: Diagnosis not present

## 2020-02-27 DIAGNOSIS — H2512 Age-related nuclear cataract, left eye: Secondary | ICD-10-CM | POA: Diagnosis not present

## 2020-02-28 DIAGNOSIS — M5416 Radiculopathy, lumbar region: Secondary | ICD-10-CM | POA: Diagnosis not present

## 2020-05-01 DIAGNOSIS — J309 Allergic rhinitis, unspecified: Secondary | ICD-10-CM | POA: Diagnosis not present

## 2020-05-01 DIAGNOSIS — I10 Essential (primary) hypertension: Secondary | ICD-10-CM | POA: Diagnosis not present

## 2020-05-01 DIAGNOSIS — F339 Major depressive disorder, recurrent, unspecified: Secondary | ICD-10-CM | POA: Diagnosis not present

## 2020-09-12 ENCOUNTER — Ambulatory Visit (INDEPENDENT_AMBULATORY_CARE_PROVIDER_SITE_OTHER): Payer: BC Managed Care – PPO

## 2020-09-12 ENCOUNTER — Ambulatory Visit (INDEPENDENT_AMBULATORY_CARE_PROVIDER_SITE_OTHER): Payer: BC Managed Care – PPO | Admitting: Podiatry

## 2020-09-12 ENCOUNTER — Other Ambulatory Visit: Payer: Self-pay

## 2020-09-12 DIAGNOSIS — M779 Enthesopathy, unspecified: Secondary | ICD-10-CM

## 2020-09-12 DIAGNOSIS — M79672 Pain in left foot: Secondary | ICD-10-CM

## 2020-09-12 MED ORDER — TRIAMCINOLONE ACETONIDE 10 MG/ML IJ SUSP
10.0000 mg | Freq: Once | INTRAMUSCULAR | Status: AC
  Administered 2020-09-12: 10 mg

## 2020-09-12 MED ORDER — DICLOFENAC SODIUM 75 MG PO TBEC: 75.0000 mg | DELAYED_RELEASE_TABLET | Freq: Two times a day (BID) | ORAL | 2 refills | Status: DC

## 2020-09-12 NOTE — Progress Notes (Signed)
Subjective:   Patient ID: Vanessa Patel, female   DOB: 65 y.o.   MRN: [id number]   HPI Patient presents with pain in the medial side of the left foot stating first was heel pain she walked differently and then this occurred   ROS      Objective:  Physical Exam  Neurovascular status intact with discomfort in the medial side of the left foot around the anterior tibial tendon and slightly distal with no tendon dysfunction     Assessment:  Probability for tendinitis cannot rule out systemic inflammatory disease     Plan:  H&P reviewed condition sterile prep injected the tendon complex 3 mg Kenalog 5 mg Xylocaine advised on ice therapy placed on oral anti-inflammatory reappoint to recheck as needed  X-ray negative for signs of bony injury associated with condition

## 2020-09-17 ENCOUNTER — Ambulatory Visit: Payer: BLUE CROSS/BLUE SHIELD | Admitting: Podiatry

## 2020-09-30 DIAGNOSIS — H43812 Vitreous degeneration, left eye: Secondary | ICD-10-CM | POA: Diagnosis not present

## 2020-11-11 DIAGNOSIS — M810 Age-related osteoporosis without current pathological fracture: Secondary | ICD-10-CM | POA: Diagnosis not present

## 2020-11-11 DIAGNOSIS — Z0000 Encounter for general adult medical examination without abnormal findings: Secondary | ICD-10-CM | POA: Diagnosis not present

## 2020-11-11 DIAGNOSIS — I10 Essential (primary) hypertension: Secondary | ICD-10-CM | POA: Diagnosis not present

## 2020-11-11 DIAGNOSIS — Z23 Encounter for immunization: Secondary | ICD-10-CM | POA: Diagnosis not present

## 2020-11-11 DIAGNOSIS — J309 Allergic rhinitis, unspecified: Secondary | ICD-10-CM | POA: Diagnosis not present

## 2020-11-11 DIAGNOSIS — F339 Major depressive disorder, recurrent, unspecified: Secondary | ICD-10-CM | POA: Diagnosis not present

## 2020-11-11 DIAGNOSIS — Z13220 Encounter for screening for lipoid disorders: Secondary | ICD-10-CM | POA: Diagnosis not present

## 2020-11-27 DIAGNOSIS — Z85828 Personal history of other malignant neoplasm of skin: Secondary | ICD-10-CM | POA: Diagnosis not present

## 2020-11-27 DIAGNOSIS — L821 Other seborrheic keratosis: Secondary | ICD-10-CM | POA: Diagnosis not present

## 2020-11-27 DIAGNOSIS — L570 Actinic keratosis: Secondary | ICD-10-CM | POA: Diagnosis not present

## 2020-11-29 ENCOUNTER — Other Ambulatory Visit: Payer: Self-pay | Admitting: Podiatry

## 2020-12-01 NOTE — Telephone Encounter (Signed)
Please advise 

## 2021-01-14 DIAGNOSIS — H43813 Vitreous degeneration, bilateral: Secondary | ICD-10-CM | POA: Diagnosis not present

## 2021-01-20 DIAGNOSIS — Z01419 Encounter for gynecological examination (general) (routine) without abnormal findings: Secondary | ICD-10-CM | POA: Diagnosis not present

## 2021-01-20 DIAGNOSIS — M810 Age-related osteoporosis without current pathological fracture: Secondary | ICD-10-CM | POA: Diagnosis not present

## 2021-01-20 DIAGNOSIS — Z124 Encounter for screening for malignant neoplasm of cervix: Secondary | ICD-10-CM | POA: Diagnosis not present

## 2021-01-20 DIAGNOSIS — Z1231 Encounter for screening mammogram for malignant neoplasm of breast: Secondary | ICD-10-CM | POA: Diagnosis not present

## 2021-01-20 DIAGNOSIS — Z6824 Body mass index (BMI) 24.0-24.9, adult: Secondary | ICD-10-CM | POA: Diagnosis not present

## 2021-02-16 DIAGNOSIS — E7800 Pure hypercholesterolemia, unspecified: Secondary | ICD-10-CM | POA: Diagnosis not present

## 2021-05-22 DIAGNOSIS — H43392 Other vitreous opacities, left eye: Secondary | ICD-10-CM | POA: Diagnosis not present

## 2021-06-03 DIAGNOSIS — E7800 Pure hypercholesterolemia, unspecified: Secondary | ICD-10-CM | POA: Diagnosis not present

## 2021-07-23 DIAGNOSIS — M5441 Lumbago with sciatica, right side: Secondary | ICD-10-CM | POA: Diagnosis not present

## 2021-07-29 DIAGNOSIS — M5416 Radiculopathy, lumbar region: Secondary | ICD-10-CM | POA: Diagnosis not present

## 2021-09-07 DIAGNOSIS — M5126 Other intervertebral disc displacement, lumbar region: Secondary | ICD-10-CM | POA: Diagnosis not present

## 2021-09-10 ENCOUNTER — Encounter: Payer: Self-pay | Admitting: Podiatry

## 2021-09-10 ENCOUNTER — Ambulatory Visit (INDEPENDENT_AMBULATORY_CARE_PROVIDER_SITE_OTHER): Payer: BC Managed Care – PPO

## 2021-09-10 ENCOUNTER — Ambulatory Visit (INDEPENDENT_AMBULATORY_CARE_PROVIDER_SITE_OTHER): Payer: BC Managed Care – PPO | Admitting: Podiatry

## 2021-09-10 DIAGNOSIS — M76822 Posterior tibial tendinitis, left leg: Secondary | ICD-10-CM

## 2021-09-10 DIAGNOSIS — R600 Localized edema: Secondary | ICD-10-CM

## 2021-09-10 DIAGNOSIS — M79672 Pain in left foot: Secondary | ICD-10-CM

## 2021-09-10 MED ORDER — TRIAMCINOLONE ACETONIDE 10 MG/ML IJ SUSP
10.0000 mg | Freq: Once | INTRAMUSCULAR | Status: AC
  Administered 2021-09-10: 10 mg

## 2021-09-12 NOTE — Progress Notes (Signed)
Subjective:   Patient ID: Vanessa Patel, female   DOB: 66 y.o.   MRN: [id number]   HPI Patient has developed quite a bit of discomfort on the medial side of the left ankle and has swelling around the ankle both medial lateral side into the midfoot +1 pitting with negative Bevelyn Buckles' sign noted when checked.  She states that this has been occurring recently   ROS      Objective:  Physical Exam  Neurovascular status was found to be intact with patient found to have negative Bevelyn Buckles' sign but does have quite a bit of swelling in the left ankle with quite a bit of discomfort in the medial side where the posterior tibial tendon comes underneath the area.  Patient has good digital perfusion well oriented x3 and is quite uncomfortable     Assessment:  Appears to be posterior tibial tendinitis left with the possibility of some form of a swelling reaction with no current indications of blood clot of the lower leg      Plan:  H&P reviewed condition and at this point I have recommended compression with Unna boot but before doing this I did do a careful steroid injection of the posterior tibial tendon sheath explained the risk to her and also signs to look out for if she should develop any swelling in her leg or any shortness of breath I want her to go straight to the emergency room.  Patient will be seen back she will leave the Unna boot on for 4 to 5 days as long as it is tolerated well will take it off earlier if needed and I dispensed surgical shoe  X-rays indicate edema did not indicate fracture did indicate moderate depression of the arch

## 2021-09-16 ENCOUNTER — Telehealth: Payer: Self-pay | Admitting: Podiatry

## 2021-09-16 ENCOUNTER — Ambulatory Visit (INDEPENDENT_AMBULATORY_CARE_PROVIDER_SITE_OTHER): Payer: BC Managed Care – PPO

## 2021-09-16 ENCOUNTER — Ambulatory Visit (INDEPENDENT_AMBULATORY_CARE_PROVIDER_SITE_OTHER): Payer: BC Managed Care – PPO | Admitting: Podiatry

## 2021-09-16 DIAGNOSIS — M109 Gout, unspecified: Secondary | ICD-10-CM

## 2021-09-16 DIAGNOSIS — M76822 Posterior tibial tendinitis, left leg: Secondary | ICD-10-CM | POA: Diagnosis not present

## 2021-09-16 DIAGNOSIS — M7752 Other enthesopathy of left foot: Secondary | ICD-10-CM | POA: Diagnosis not present

## 2021-09-16 MED ORDER — METHYLPREDNISOLONE 4 MG PO TBPK: ORAL_TABLET | ORAL | 0 refills | Status: DC

## 2021-09-16 NOTE — Telephone Encounter (Signed)
Error

## 2021-09-16 NOTE — Telephone Encounter (Signed)
Pt was seen in the office on 8/2 for swollen ankle, received an injection. She states that ankle has gone down but now bunion is extremely swollen. Please advise

## 2021-09-16 NOTE — Telephone Encounter (Signed)
Should come back in this afternoon or next week

## 2021-09-16 NOTE — Patient Instructions (Signed)
Gout  Gout is painful swelling of your joints. Gout is a type of arthritis. It is caused by having too much uric acid in your body. Uric acid is a chemical that is made when your body breaks down substances called purines. If your body has too much uric acid, sharp crystals can form and build up in your joints. This causes pain and swelling. Gout attacks can happen quickly and be very painful (acute gout). Over time, the attacks can affect more joints and happen more often (chronic gout). What are the causes? Gout is caused by too much uric acid in your blood. This can happen because: Your kidneys do not remove enough uric acid from your blood. Your body makes too much uric acid. You eat too many foods that are high in purines. These foods include organ meats, some seafood, and beer. Trauma or stress can bring on an attack. What increases the risk? Having a family history of gout. Being female and middle-aged. Being female and having gone through menopause. Having an organ transplant. Taking certain medicines. Having certain conditions, such as: Being very overweight (obese). Lead poisoning. Kidney disease. A skin condition called psoriasis. Other risks include: Losing weight too quickly. Not having enough water in the body (being dehydrated). Drinking alcohol, especially beer. Drinking beverages that are sweetened with a type of sugar called fructose. What are the signs or symptoms? An attack of acute gout often starts at night and usually happens in just one joint. The most common place is the big toe. Other joints that may be affected include joints of the feet, ankle, knee, fingers, wrist, or elbow. Symptoms may include: Very bad pain. Warmth. Swelling. Stiffness. Tenderness. The affected joint may be very painful to touch. Shiny, red, or purple skin. Chills and fever. Chronic gout may cause symptoms more often. More joints may be involved. You may also have white or yellow lumps  (tophi) on your hands or feet or in other areas near your joints. How is this treated? Treatment for an acute attack may include medicines for pain and swelling, such as: NSAIDs, such as ibuprofen. Steroids taken by mouth or injected into a joint. Colchicine. This can be given by mouth or through an IV tube. Treatment to prevent future attacks may include: Taking small doses of NSAIDs or colchicine daily. Using a medicine that reduces uric acid levels in your blood, such as allopurinol. Making changes to your diet. You may need to see a food expert (dietitian) about what to eat and drink to prevent gout. Follow these instructions at home: During a gout attack  If told, put ice on the painful area. To do this: Put ice in a plastic bag. Place a towel between your skin and the bag. Leave the ice on for 20 minutes, 2-3 times a day. Take off the ice if your skin turns bright red. This is very important. If you cannot feel pain, heat, or cold, you have a greater risk of damage to the area. Raise the painful joint above the level of your heart as often as you can. Rest the joint as much as possible. If the joint is in your leg, you may be given crutches. Follow instructions from your doctor about what you cannot eat or drink. Avoiding future gout attacks Eat a low-purine diet. Avoid foods and drinks such as: Liver. Kidney. Anchovies. Asparagus. Herring. Mushrooms. Mussels. Beer. Stay at a healthy weight. If you want to lose weight, talk with your doctor. Do not   lose weight too fast. Start or continue an exercise plan as told by your doctor. Eating and drinking Avoid drinks sweetened by fructose. Drink enough fluids to keep your pee (urine) pale yellow. If you drink alcohol: Limit how much you have to: 0-1 drink a day for women who are not pregnant. 0-2 drinks a day for men. Know how much alcohol is in a drink. In the [place] one drink equals one 12 oz bottle of beer (355 mL), one 5 oz  glass of wine (148 mL), or one 1 oz glass of hard liquor (44 mL). General instructions Take over-the-counter and prescription medicines only as told by your doctor. Ask your doctor if you should avoid driving or using machines while you are taking your medicine. Return to your normal activities when your doctor says that it is safe. Keep all follow-up visits. Where to find more information National Institutes of Health: www.niams.nih.gov Contact a doctor if: You have another gout attack. You still have symptoms of a gout attack after 10 days of treatment. You have problems (side effects) because of your medicines. You have chills or a fever. You have burning pain when you pee (urinate). You have pain in your lower back or belly. Get help right away if: You have very bad pain. Your pain cannot be controlled. You cannot pee. Summary Gout is painful swelling of the joints. The most common site of pain is the big toe, but it can affect other joints. Medicines and avoiding some foods can help to prevent and treat gout attacks. This information is not intended to replace advice given to you by your health care provider. Make sure you discuss any questions you have with your health care provider. Document Revised: 10/29/2020 Document Reviewed: 10/29/2020 Elsevier Patient Education  2023 Elsevier Inc.  

## 2021-09-17 NOTE — Progress Notes (Signed)
Subjective:   Patient ID: Vanessa Patel, female   DOB: 66 y.o.   MRN: [id number]   HPI Patient has developed acute inflammation pain around the first MPJ left with fluid buildup and states the midfoot and ankle that we treated her doing well   ROS      Objective:  Physical Exam  Acute redness and inflammation around the first MPJ left with quite a bit of pain in the joint surface and fluid buildup     Assessment:  Appears to be an acute gout attack left first MPJ first when she is ever had along with capsulitis and pain of the first MPJ     Plan:  H&P reviewed gout with her and I did give her instructions on food modification we discussed the possibility for meds in the future.  At this point I did do sterile prep and injected the first MPJ periarticular 3 mg dexamethasone Kenalog 5 mg Xylocaine and I placed on a 6-day steroid Dosepak patient to be seen back  X-rays were negative for bone infection osteolysis around this area with soft tissue swelling noted

## 2021-09-23 ENCOUNTER — Other Ambulatory Visit: Payer: Self-pay | Admitting: Neurosurgery

## 2021-09-23 DIAGNOSIS — M5126 Other intervertebral disc displacement, lumbar region: Secondary | ICD-10-CM

## 2021-10-08 ENCOUNTER — Ambulatory Visit
Admission: RE | Admit: 2021-10-08 | Discharge: 2021-10-08 | Disposition: A | Discharge status: Home / Self Care | Payer: BC Managed Care – PPO | Source: Ambulatory Visit | Attending: Neurosurgery | Admitting: Neurosurgery

## 2021-10-08 DIAGNOSIS — M25552 Pain in left hip: Secondary | ICD-10-CM | POA: Diagnosis not present

## 2021-10-08 DIAGNOSIS — M5126 Other intervertebral disc displacement, lumbar region: Secondary | ICD-10-CM

## 2021-10-08 DIAGNOSIS — M545 Low back pain, unspecified: Secondary | ICD-10-CM | POA: Diagnosis not present

## 2021-10-08 DIAGNOSIS — M48061 Spinal stenosis, lumbar region without neurogenic claudication: Secondary | ICD-10-CM | POA: Diagnosis not present

## 2021-10-15 DIAGNOSIS — M4726 Other spondylosis with radiculopathy, lumbar region: Secondary | ICD-10-CM | POA: Diagnosis not present

## 2021-10-27 ENCOUNTER — Telehealth: Payer: Self-pay

## 2021-10-28 ENCOUNTER — Other Ambulatory Visit: Payer: Self-pay | Admitting: Podiatry

## 2021-10-28 ENCOUNTER — Telehealth: Payer: Self-pay | Admitting: *Deleted

## 2021-10-28 NOTE — Telephone Encounter (Signed)
No further evaluation is needed at this time.

## 2021-10-28 NOTE — Telephone Encounter (Signed)
Patient is calling to request help with the swelling in left ankle until she is seen on 09/25, (possible Medrol dose Pak)please advise

## 2021-10-28 NOTE — Telephone Encounter (Signed)
I'll decide on Friday

## 2021-10-29 NOTE — Telephone Encounter (Signed)
Spoke with patient giving information per provider,to discuss on 11/02/21,verbalized understanding.

## 2021-10-30 NOTE — Telephone Encounter (Signed)
Want to wait until I see her next week

## 2021-11-02 ENCOUNTER — Encounter: Payer: Self-pay | Admitting: Podiatry

## 2021-11-02 ENCOUNTER — Ambulatory Visit (INDEPENDENT_AMBULATORY_CARE_PROVIDER_SITE_OTHER): Payer: BC Managed Care – PPO | Admitting: Podiatry

## 2021-11-02 DIAGNOSIS — R600 Localized edema: Secondary | ICD-10-CM | POA: Diagnosis not present

## 2021-11-02 DIAGNOSIS — M76822 Posterior tibial tendinitis, left leg: Secondary | ICD-10-CM | POA: Diagnosis not present

## 2021-11-02 NOTE — Progress Notes (Signed)
Subjective:   Patient ID: Vanessa Patel, female   DOB: 66 y.o.   MRN: [id number]   HPI Patient presents stating that she is improved with minimal discomfort and that she is wearing good support shoes and her brace which are helping neurovascular   ROS      Objective:  Physical Exam  Status intact with inflammation around the posterior tibial tendon left that is improving still mildly painful but better than previous     Assessment:  Posterior tibial tendinitis left improving but present     Plan:  Reviewed condition and went ahead today continue with brace usage ice therapy and support the patient will be seen back to recheck with all questions answered today

## 2021-11-12 DIAGNOSIS — M5416 Radiculopathy, lumbar region: Secondary | ICD-10-CM | POA: Diagnosis not present

## 2021-11-16 DIAGNOSIS — M4726 Other spondylosis with radiculopathy, lumbar region: Secondary | ICD-10-CM | POA: Diagnosis not present

## 2021-11-17 DIAGNOSIS — M5416 Radiculopathy, lumbar region: Secondary | ICD-10-CM | POA: Diagnosis not present

## 2021-11-19 DIAGNOSIS — M5416 Radiculopathy, lumbar region: Secondary | ICD-10-CM | POA: Diagnosis not present

## 2021-11-23 DIAGNOSIS — I10 Essential (primary) hypertension: Secondary | ICD-10-CM | POA: Diagnosis not present

## 2021-11-23 DIAGNOSIS — F339 Major depressive disorder, recurrent, unspecified: Secondary | ICD-10-CM | POA: Diagnosis not present

## 2021-11-23 DIAGNOSIS — Z23 Encounter for immunization: Secondary | ICD-10-CM | POA: Diagnosis not present

## 2021-11-23 DIAGNOSIS — Z0000 Encounter for general adult medical examination without abnormal findings: Secondary | ICD-10-CM | POA: Diagnosis not present

## 2021-11-23 DIAGNOSIS — M810 Age-related osteoporosis without current pathological fracture: Secondary | ICD-10-CM | POA: Diagnosis not present

## 2021-11-23 DIAGNOSIS — E7800 Pure hypercholesterolemia, unspecified: Secondary | ICD-10-CM | POA: Diagnosis not present

## 2021-11-26 DIAGNOSIS — M5416 Radiculopathy, lumbar region: Secondary | ICD-10-CM | POA: Diagnosis not present

## 2021-12-01 DIAGNOSIS — M5416 Radiculopathy, lumbar region: Secondary | ICD-10-CM | POA: Diagnosis not present

## 2021-12-03 DIAGNOSIS — M5416 Radiculopathy, lumbar region: Secondary | ICD-10-CM | POA: Diagnosis not present

## 2022-01-25 DIAGNOSIS — M810 Age-related osteoporosis without current pathological fracture: Secondary | ICD-10-CM | POA: Diagnosis not present

## 2022-01-25 DIAGNOSIS — Z01419 Encounter for gynecological examination (general) (routine) without abnormal findings: Secondary | ICD-10-CM | POA: Diagnosis not present

## 2022-01-25 DIAGNOSIS — Z124 Encounter for screening for malignant neoplasm of cervix: Secondary | ICD-10-CM | POA: Diagnosis not present

## 2022-01-25 DIAGNOSIS — Z1231 Encounter for screening mammogram for malignant neoplasm of breast: Secondary | ICD-10-CM | POA: Diagnosis not present

## 2022-02-23 ENCOUNTER — Encounter: Payer: Self-pay | Admitting: Internal Medicine

## 2022-07-31 ENCOUNTER — Ambulatory Visit
Admission: EM | Admit: 2022-07-31 | Discharge: 2022-07-31 | Disposition: A | Discharge status: Home / Self Care | Payer: BC Managed Care – PPO | Attending: Urgent Care | Admitting: Urgent Care

## 2022-07-31 DIAGNOSIS — L249 Irritant contact dermatitis, unspecified cause: Secondary | ICD-10-CM

## 2022-07-31 DIAGNOSIS — R21 Rash and other nonspecific skin eruption: Secondary | ICD-10-CM | POA: Diagnosis not present

## 2022-07-31 MED ORDER — METHYLPREDNISOLONE ACETATE 80 MG/ML IJ SUSP
80.0000 mg | Freq: Once | INTRAMUSCULAR | Status: AC
  Administered 2022-07-31: 80 mg via INTRAMUSCULAR

## 2022-07-31 MED ORDER — HYDROXYZINE HCL 25 MG PO TABS: 12.5000 mg | ORAL_TABLET | Freq: Three times a day (TID) | ORAL | 0 refills | Status: AC | PRN

## 2022-07-31 NOTE — ED Provider Notes (Signed)
[facility]  Note:  This document was prepared using Conservation officer, historic buildings and may include unintentional dictation errors.  MRN: [id number] DOB: Sep 12, 1955  Subjective:   Vanessa Patel is a 67 y.o. female presenting for 1 day history of acute onset persistent worsening rash of the lower legs and the eyes.  Symptoms started after she was doing work in her garden.  She tried to use an oil as she was worried about chiggers and feels like she ended up rubbing her eyes too much.  She has significant itching.  No pain, facial swelling, oral swelling, chest tightness, shortness of breath or wheezing.  No history of anaphylactic reactions.  No diabetes.  No congestive heart failure.   Current Facility-Administered Medications:    0.9 %  sodium chloride infusion, 500 mL, Intravenous, Continuous, Pyrtle, Carie Caddy, MD  Current Outpatient Medications:    alendronate (FOSAMAX) 70 MG tablet, Take 70 mg by mouth once a week. Take with a full glass of water on an empty stomach., Disp: , Rfl:    amLODipine (NORVASC) 5 MG tablet, Take 5 mg by mouth daily., Disp: , Rfl:    amoxicillin-clavulanate (AUGMENTIN) 875-125 MG tablet, amoxicillin 875 mg-potassium clavulanate 125 mg tablet, Disp: , Rfl:    Calcium Carbonate-Vitamin D (CALCIUM 500 + D) 500-125 MG-UNIT TABS, Take by mouth 2 (two) times daily.  , Disp: , Rfl:    cephALEXin (KEFLEX) 500 MG capsule, , Disp: , Rfl:    cetirizine (ZYRTEC) 10 MG tablet, Take 10 mg by mouth daily., Disp: , Rfl:    diclofenac (VOLTAREN) 75 MG EC tablet, TAKE 1 TABLET(75 MG) BY MOUTH TWICE DAILY, Disp: 50 tablet, Rfl: 2   FLUoxetine (PROZAC) 20 MG capsule, , Disp: , Rfl:    halobetasol (ULTRAVATE) 0.05 % ointment, halobetasol propionate 0.05 % topical ointment, Disp: , Rfl:    hydrochlorothiazide (HYDRODIURIL) 25 MG tablet, , Disp: , Rfl:    HYDROcodone-acetaminophen (NORCO/VICODIN) 5-325 MG tablet, hydrocodone 5 mg-acetaminophen 325 mg tablet,  Disp: , Rfl:    hydrocortisone (PROCTOZONE-HC) 2.5 % rectal cream, Proctozone-HC 2.5 % topical cream perineal applicator, Disp: , Rfl:    ibuprofen (ADVIL,MOTRIN) 800 MG tablet, Take 1 tablet (800 mg total) by mouth every 8 (eight) hours as needed., Disp: 30 tablet, Rfl: 0   levofloxacin (LEVAQUIN) 750 MG tablet, levofloxacin 750 mg tablet, Disp: , Rfl:    lisinopril (PRINIVIL,ZESTRIL) 10 MG tablet, Take 10 mg by mouth daily., Disp: , Rfl:    loratadine-pseudoephedrine (CLARITIN-D 24-HOUR) 10-240 MG 24 hr tablet, Take 1 tablet daily by mouth., Disp: , Rfl:    methylPREDNISolone (MEDROL DOSEPAK) 4 MG TBPK tablet, follow package directions, Disp: 21 tablet, Rfl: 0   metoprolol succinate (TOPROL-XL) 25 MG 24 hr tablet, metoprolol succinate ER 25 mg tablet,extended release 24 hr, Disp: , Rfl:    montelukast (SINGULAIR) 10 MG tablet, Take 10 mg by mouth at bedtime., Disp: , Rfl:    mupirocin ointment (BACTROBAN) 2 %, , Disp: , Rfl:    nebivolol (BYSTOLIC) 10 MG tablet, Bystolic 10 mg tablet, Disp: , Rfl:    Omeprazole (PRILOSEC PO), Prilosec, Disp: , Rfl:    omeprazole (PRILOSEC) 20 MG capsule, Take 20 mg by mouth daily.  , Disp: , Rfl:    ondansetron (ZOFRAN ODT) 4 MG disintegrating tablet, Take 1 tablet (4 mg total) by mouth every 8 (eight) hours as needed for nausea or vomiting., Disp: 6 tablet, Rfl: 0   oxyCODONE-acetaminophen (PERCOCET/ROXICET) 5-325  MG tablet, Take 2 tablets by mouth every 6 (six) hours as needed for severe pain., Disp: 12 tablet, Rfl: 0   potassium chloride (K-DUR) 10 MEQ tablet, potassium chloride ER 10 mEq tablet,extended release(part/cryst), Disp: , Rfl:    sulfamethoxazole-trimethoprim (BACTRIM DS) 800-160 MG tablet, sulfamethoxazole 800 mg-trimethoprim 160 mg tablet, Disp: , Rfl:    No Known Allergies  Past Medical History:  Diagnosis Date   Allergy    Arthritis    Depression    Environmental allergies    GERD (gastroesophageal reflux disease)    Hypertension     Osteoporosis    Seasonal allergies    Wears glasses      Past Surgical History:  Procedure Laterality Date   APPENDECTOMY     COLONOSCOPY     HARDWARE REMOVAL  06/08/2011   Procedure: HARDWARE REMOVAL;  Surgeon: Tami Ribas, MD;  Location: [facility];  Service: Orthopedics;  Laterality: Left;  FROM LEFT WRIST   LAPAROSCOPIC ENDOMETRIOSIS FULGURATION     years ago    left wrist     2007 left wrist plate fracture   left wrist fracture surgery  2013   this is second break froma fall   MASS EXCISION Left 05/14/2013   Procedure: EXCISION MASS LEFT LONG FINGER;  Surgeon: Tami Ribas, MD;  Location: [facility];  Service: Orthopedics;  Laterality: Left;   POLYPECTOMY     TENDON REPAIR  06/08/2011   Procedure: TENDON REPAIR;  Surgeon: Tami Ribas, MD;  Location: [facility];  Service: Orthopedics;  Laterality: Left;  LEFT FPL RECONSTRUCTION     Family History  Problem Relation Age of Onset   Colon polyps Mother    Colon cancer Neg Hx    Esophageal cancer Neg Hx    Rectal cancer Neg Hx    Stomach cancer Neg Hx    Pancreatic cancer Neg Hx    Breast cancer Neg Hx     Social History   Tobacco Use   Smoking status: Former    Types: Cigarettes    Quit date: 11/09/1990    Years since quitting: 31.7   Smokeless tobacco: Never  Vaping Use   Vaping Use: Never used  Substance Use Topics   Alcohol use: Not Currently    Comment: oc   Drug use: No    ROS   Objective:   Vitals: BP 128/78 (BP Location: Left Arm)   Pulse (!) 101   Temp 98.8 F (37.1 C) (Oral)   Resp 20   SpO2 98%   Physical Exam Constitutional:      General: She is not in acute distress.    Appearance: Normal appearance. She is well-developed. She is not ill-appearing, toxic-appearing or diaphoretic.  HENT:     Head: Normocephalic and atraumatic.     Nose: Nose normal.     Mouth/Throat:     Mouth: Mucous membranes are moist.  Eyes:     General: Lids are  normal. Lids are everted, no foreign bodies appreciated. Vision grossly intact. No scleral icterus.       Right eye: No foreign body, discharge or hordeolum.        Left eye: No foreign body, discharge or hordeolum.     Extraocular Movements: Extraocular movements intact.     Right eye: Normal extraocular motion.     Left eye: Normal extraocular motion and no nystagmus.     Conjunctiva/sclera:     Right eye: Right  conjunctiva is not injected. No chemosis, exudate or hemorrhage.    Left eye: Left conjunctiva is not injected. No chemosis, exudate or hemorrhage.  Cardiovascular:     Rate and Rhythm: Normal rate and regular rhythm.     Heart sounds: Normal heart sounds. No murmur heard.    No friction rub. No gallop.  Pulmonary:     Effort: Pulmonary effort is normal. No respiratory distress.     Breath sounds: No stridor. No wheezing, rhonchi or rales.  Chest:     Chest wall: No tenderness.  Skin:    General: Skin is warm and dry.     Findings: Rash (diffuse scattered solitary erythematic nodular lesions over the lower legs bilaterally) present.  Neurological:     General: No focal deficit present.     Mental Status: She is alert and oriented to person, place, and time.  Psychiatric:        Mood and Affect: Mood normal.        Behavior: Behavior normal.    IM Depo-Medrol 80 mg administered in clinic.  Assessment and Plan :   PDMP not reviewed this encounter.  1. Irritant dermatitis   2. Rash and nonspecific skin eruption    Patient requested injectable steroids and I am in agreement to help with a significant irritant dermatitis of the lower legs affecting the eyes due to contact from the hands.  No signs of anaphylaxis.  Recommend hydroxyzine for the itching.  Counseled patient on potential for adverse effects with medications prescribed/recommended today, ER and return-to-clinic precautions discussed, patient verbalized understanding.    Wallis Bamberg, [place] 07/31/22 1042

## 2022-07-31 NOTE — ED Triage Notes (Signed)
Pt states she woke yesterday am with scattered rash/redness around eyes after working in garden-states she used "neem oil" and "chigger x" on face and skin-NAD-steady gait

## 2022-08-02 ENCOUNTER — Ambulatory Visit (HOSPITAL_COMMUNITY): Payer: Self-pay

## 2023-01-27 ENCOUNTER — Other Ambulatory Visit: Payer: Self-pay | Admitting: Obstetrics & Gynecology

## 2023-01-27 DIAGNOSIS — M810 Age-related osteoporosis without current pathological fracture: Secondary | ICD-10-CM

## 2023-12-05 ENCOUNTER — Other Ambulatory Visit (HOSPITAL_BASED_OUTPATIENT_CLINIC_OR_DEPARTMENT_OTHER): Payer: Self-pay | Admitting: Physician Assistant

## 2023-12-05 DIAGNOSIS — J309 Allergic rhinitis, unspecified: Secondary | ICD-10-CM | POA: Diagnosis not present

## 2023-12-05 DIAGNOSIS — Z1159 Encounter for screening for other viral diseases: Secondary | ICD-10-CM | POA: Diagnosis not present

## 2023-12-05 DIAGNOSIS — R7303 Prediabetes: Secondary | ICD-10-CM | POA: Diagnosis not present

## 2023-12-05 DIAGNOSIS — R609 Edema, unspecified: Secondary | ICD-10-CM | POA: Diagnosis not present

## 2023-12-05 DIAGNOSIS — E7800 Pure hypercholesterolemia, unspecified: Secondary | ICD-10-CM | POA: Diagnosis not present

## 2023-12-05 DIAGNOSIS — I10 Essential (primary) hypertension: Secondary | ICD-10-CM | POA: Diagnosis not present

## 2023-12-05 DIAGNOSIS — Z1211 Encounter for screening for malignant neoplasm of colon: Secondary | ICD-10-CM | POA: Diagnosis not present

## 2023-12-05 DIAGNOSIS — Z23 Encounter for immunization: Secondary | ICD-10-CM | POA: Diagnosis not present

## 2023-12-05 DIAGNOSIS — Z0000 Encounter for general adult medical examination without abnormal findings: Secondary | ICD-10-CM | POA: Diagnosis not present

## 2023-12-05 DIAGNOSIS — M810 Age-related osteoporosis without current pathological fracture: Secondary | ICD-10-CM | POA: Diagnosis not present

## 2023-12-19 ENCOUNTER — Ambulatory Visit (HOSPITAL_COMMUNITY)
Admission: RE | Admit: 2023-12-19 | Discharge: 2023-12-19 | Disposition: A | Discharge status: Home / Self Care | Source: Ambulatory Visit | Attending: Physician Assistant | Admitting: Physician Assistant

## 2023-12-19 DIAGNOSIS — E7800 Pure hypercholesterolemia, unspecified: Secondary | ICD-10-CM | POA: Insufficient documentation

## 2024-01-20 ENCOUNTER — Encounter: Payer: Self-pay | Admitting: Gastroenterology

## 2024-02-27 ENCOUNTER — Ambulatory Visit: Admitting: Gastroenterology

## 2024-02-27 ENCOUNTER — Encounter: Payer: Self-pay | Admitting: Gastroenterology

## 2024-02-27 VITALS — BP 112/72 | HR 100 | Ht 62.0 in | Wt 134.1 lb

## 2024-02-27 DIAGNOSIS — Z860100 Personal history of colon polyps, unspecified: Secondary | ICD-10-CM

## 2024-02-27 DIAGNOSIS — Z860101 Personal history of adenomatous and serrated colon polyps: Secondary | ICD-10-CM

## 2024-02-27 DIAGNOSIS — K219 Gastro-esophageal reflux disease without esophagitis: Secondary | ICD-10-CM | POA: Diagnosis not present

## 2024-02-27 DIAGNOSIS — K5900 Constipation, unspecified: Secondary | ICD-10-CM

## 2024-02-27 DIAGNOSIS — R911 Solitary pulmonary nodule: Secondary | ICD-10-CM

## 2024-02-27 DIAGNOSIS — K2289 Other specified disease of esophagus: Secondary | ICD-10-CM | POA: Diagnosis not present

## 2024-02-27 DIAGNOSIS — R110 Nausea: Secondary | ICD-10-CM | POA: Diagnosis not present

## 2024-02-27 DIAGNOSIS — K449 Diaphragmatic hernia without obstruction or gangrene: Secondary | ICD-10-CM

## 2024-02-27 DIAGNOSIS — Z83719 Family history of colon polyps, unspecified: Secondary | ICD-10-CM | POA: Diagnosis not present

## 2024-02-27 DIAGNOSIS — R194 Change in bowel habit: Secondary | ICD-10-CM

## 2024-02-27 MED ORDER — NA SULFATE-K SULFATE-MG SULF 17.5-3.13-1.6 GM/177ML PO SOLN: 1.0000 | Freq: Once | ORAL | 0 refills | Status: AC

## 2024-02-27 NOTE — Progress Notes (Signed)
 "  Chief Complaint: hiatal hernia, change in bowels Primary GI MD: Dr. Albertus  HPI:  Patient referred here by PCP Discussed hiatal hernia.  Patient had CT coronary then noted large hiatal hernia with patulous esophagus as well as incidental pulmonary nodules.  No previous EGD.   Discussed the use of AI scribe software for clinical note transcription with the patient, who gave verbal consent to proceed.  History of Present Illness   Vanessa Patel is a 69 year old female with hiatal hernia and chronic gastroesophageal reflux who presents for evaluation of gastrointestinal symptoms and follow-up of a large hiatal hernia.  Initially diagnosed with a small hiatal hernia three years ago, she was recently found to have a large hiatal hernia on CT coronary scan. She describes intermittent bulging and discomfort in the upper abdomen.  She has longstanding gastroesophageal reflux symptoms, managed with daily omeprazole 20 mg. Without omeprazole, she develops pain; with regular use, heartburn is absent. No prior endoscopy has been performed for reflux evaluation.  Approximately every two weeks, and sometimes more frequently, she experiences episodes of malaise followed by coughing and vomiting bile. She denies vomiting food and has not identified a clear association with eating.  Reports a recent change in bowel habits, with infrequent bowel movements occurring every two to three days. Stools are described as very heavy, thick, and sometimes adherent to the toilet. Occasionally, she feels the urge to defecate but is unable to do so. No rectal bleeding. She reports using Miralax or fiber supplements.  She does not use regular ibuprofen , Goody powders, other over-the-counter analgesics, or narcotic pain medications. No use of Voltaren  or diclofenac  for arthritis.  Her last colonoscopy was reportedly normal, and she is due for a repeat procedure. Family history is notable for her mother having a  precancerous colon polyp. Her first colonoscopy was in 2012.   PREVIOUS GI WORKUP   Colonoscopy 2018 with diverticulosis, otherwise unremarkable  Past Medical History:  Diagnosis Date   Allergy    Arthritis    Depression    Environmental allergies    GERD (gastroesophageal reflux disease)    Hypertension    Osteoporosis    Seasonal allergies    Wears glasses     Past Surgical History:  Procedure Laterality Date   APPENDECTOMY     COLONOSCOPY     HARDWARE REMOVAL  06/08/2011   Procedure: HARDWARE REMOVAL;  Surgeon: Franky JONELLE Curia, MD;  Location: [facility];  Service: Orthopedics;  Laterality: Left;  FROM LEFT WRIST   LAPAROSCOPIC ENDOMETRIOSIS FULGURATION     years ago    left wrist     2007 left wrist plate fracture   left wrist fracture surgery  2013   this is second break froma fall   MASS EXCISION Left 05/14/2013   Procedure: EXCISION MASS LEFT LONG FINGER;  Surgeon: Franky JONELLE Curia, MD;  Location: [facility];  Service: Orthopedics;  Laterality: Left;   POLYPECTOMY     TENDON REPAIR  06/08/2011   Procedure: TENDON REPAIR;  Surgeon: Franky JONELLE Curia, MD;  Location: [facility];  Service: Orthopedics;  Laterality: Left;  LEFT FPL RECONSTRUCTION     Current Outpatient Medications  Medication Sig Dispense Refill   amLODipine (NORVASC) 5 MG tablet Take 5 mg by mouth daily.     Calcium Carbonate-Vitamin D (CALCIUM 500 + D) 500-125 MG-UNIT TABS Take by mouth 2 (two) times daily.       cetirizine (ZYRTEC) 10  MG tablet Take 10 mg by mouth daily.     FLUoxetine (PROZAC) 20 MG capsule      hydrochlorothiazide (HYDRODIURIL) 25 MG tablet      hydrocortisone (PROCTOZONE-HC) 2.5 % rectal cream Proctozone-HC 2.5 % topical cream perineal applicator     hydrOXYzine  (ATARAX ) 25 MG tablet Take 0.5-1 tablets (12.5-25 mg total) by mouth every 8 (eight) hours as needed for itching. 30 tablet 0   ibuprofen  (ADVIL ,MOTRIN ) 800 MG tablet Take 1 tablet (800 mg  total) by mouth every 8 (eight) hours as needed. 30 tablet 0   levofloxacin (LEVAQUIN) 750 MG tablet levofloxacin 750 mg tablet     lisinopril (PRINIVIL,ZESTRIL) 10 MG tablet Take 10 mg by mouth daily.     loratadine-pseudoephedrine (CLARITIN-D 24-HOUR) 10-240 MG 24 hr tablet Take 1 tablet daily by mouth.     metoprolol succinate (TOPROL-XL) 25 MG 24 hr tablet metoprolol succinate ER 25 mg tablet,extended release 24 hr     montelukast (SINGULAIR) 10 MG tablet Take 10 mg by mouth at bedtime.     mupirocin ointment (BACTROBAN) 2 %      Na Sulfate-K Sulfate-Mg Sulfate concentrate (SUPREP) 17.5-3.13-1.6 GM/177ML SOLN Take 1 kit (354 mLs total) by mouth once for 1 dose. 354 mL 0   Omeprazole (PRILOSEC PO) Prilosec     potassium chloride (K-DUR) 10 MEQ tablet potassium chloride ER 10 mEq tablet,extended release(part/cryst)     sulfamethoxazole-trimethoprim (BACTRIM DS) 800-160 MG tablet sulfamethoxazole 800 mg-trimethoprim 160 mg tablet     alendronate (FOSAMAX) 70 MG tablet Take 70 mg by mouth once a week. Take with a full glass of water on an empty stomach. (Patient not taking: Reported on 02/27/2024)     amoxicillin-clavulanate (AUGMENTIN) 875-125 MG tablet amoxicillin 875 mg-potassium clavulanate 125 mg tablet (Patient not taking: Reported on 02/27/2024)     diclofenac  (VOLTAREN ) 75 MG EC tablet TAKE 1 TABLET(75 MG) BY MOUTH TWICE DAILY 50 tablet 2   HYDROcodone -acetaminophen  (NORCO/VICODIN) 5-325 MG tablet hydrocodone  5 mg-acetaminophen  325 mg tablet     nebivolol (BYSTOLIC) 10 MG tablet Bystolic 10 mg tablet (Patient not taking: Reported on 02/27/2024)     omeprazole (PRILOSEC) 20 MG capsule Take 20 mg by mouth daily.   (Patient not taking: Reported on 02/27/2024)     Current Facility-Administered Medications  Medication Dose Route Frequency Provider Last Rate Last Admin   0.9 %  sodium chloride  infusion  500 mL Intravenous Continuous Pyrtle, Gordy HERO, MD        Allergies as of 02/27/2024   (No  Known Allergies)    Family History  Problem Relation Age of Onset   Colon polyps Mother    Colon cancer Neg Hx    Esophageal cancer Neg Hx    Rectal cancer Neg Hx    Stomach cancer Neg Hx    Pancreatic cancer Neg Hx    Breast cancer Neg Hx     Social History   Socioeconomic History   Marital status: Married    Spouse name: Not on file   Number of children: Not on file   Years of education: Not on file   Highest education level: Not on file  Occupational History   Not on file  Tobacco Use   Smoking status: Former    Current packs/day: 0.00    Types: Cigarettes    Quit date: 11/09/1990    Years since quitting: 33.3   Smokeless tobacco: Never  Vaping Use   Vaping status: Never Used  Substance  and Sexual Activity   Alcohol use: Not Currently    Comment: oc   Drug use: No   Sexual activity: Not on file  Other Topics Concern   Not on file  Social History Narrative   Not on file   Social Drivers of Health   Tobacco Use: Medium Risk (02/27/2024)   Patient History    Smoking Tobacco Use: Former    Smokeless Tobacco Use: Never    Passive Exposure: Not on Actuary Strain: Not on file  Food Insecurity: Not on file  Transportation Needs: Not on file  Physical Activity: Not on file  Stress: Not on file  Social Connections: Not on file  Intimate Partner Violence: Not on file  Depression (PHQ2-9): Not on file  Alcohol Screen: Not on file  Housing: Not on file  Utilities: Not on file  Health Literacy: Not on file    Review of Systems:    Constitutional: No weight loss, fever, chills, weakness or fatigue HEENT: Eyes: No change in vision               Ears, Nose, Throat:  No change in hearing or congestion Skin: No rash or itching Cardiovascular: No chest pain, chest pressure or palpitations   Respiratory: No SOB or cough Gastrointestinal: See HPI and otherwise negative Genitourinary: No dysuria or change in urinary frequency Neurological: No  headache, dizziness or syncope Musculoskeletal: No new muscle or joint pain Hematologic: No bleeding or bruising Psychiatric: No history of depression or anxiety    Physical Exam:  Vital signs: BP 112/72   Pulse 100   Ht 5' 2 (1.575 m)   Wt 134 lb 2 oz (60.8 kg)   BMI 24.53 kg/m   Constitutional: NAD, alert and cooperative Head:  Normocephalic and atraumatic. Eyes:   PEERL, EOMI. No icterus. Conjunctiva pink. Respiratory: Respirations even and unlabored. Lungs clear to auscultation bilaterally.   No wheezes, crackles, or rhonchi.  Cardiovascular:  Regular rate and rhythm. No peripheral edema, cyanosis or pallor.  Gastrointestinal:  Soft, nondistended, nontender. No rebound or guarding. Normal bowel sounds. No appreciable masses or hepatomegaly. Rectal:  Declines Msk:  Symmetrical without gross deformities. Without edema, no deformity or joint abnormality.  Neurologic:  Alert and  oriented x4;  grossly normal neurologically.  Skin:   Dry and intact without significant lesions or rashes. Psychiatric: Oriented to person, place and time. Demonstrates good judgement and reason without abnormal affect or behaviors.  Physical Exam    RELEVANT LABS AND IMAGING: CBC    Component Value Date/Time   HGB 13.5 05/14/2013 0752   HCT 39.0 09/30/2011 0940    CMP     Component Value Date/Time   NA 134 (L) 05/10/2013 1210   K 4.0 05/10/2013 1210   CL 93 (L) 05/10/2013 1210   CO2 27 05/10/2013 1210   GLUCOSE 79 05/10/2013 1210   BUN 11 05/10/2013 1210   CREATININE 0.65 05/10/2013 1210   CALCIUM 9.3 05/10/2013 1210   GFRNONAA >90 05/10/2013 1210   GFRAA >90 05/10/2013 1210     Assessment/Plan:   Hiatal hernia GERD Vomiting Patient had CT coronary then noted large hiatal hernia with patulous esophagus as well as incidental pulmonary nodules.  No previous EGD. Chronic GERD on omeprazole 20mg  once daily. Episodes of coughing/vomiting bile (she is unsure if associated with  eating).  Previous diclofenac  use. - EGD to evaluate for Barrett's esophagus, esophagitis, gastritis, extent of hiatal hernia - She wishes to have EGD  and colonoscopy together - Continue omeprazole 20 mg daily - Educated patient lifestyle modifications and provided patient education handouts - Discussed risks/benefits of procedure - Follow-up with me after colonoscopy/endoscopy  Change in bowel habits Over the last few months reports change in bowel habits to now having constipation.  Not on a bowel regimen. - MiraLAX 1 capful a day and adjust as based on response - Increase water, increase fiber, increase exercise - She is due for colonoscopy, schedule colonoscopy with EGD per patient request and to evaluate change in bowels  Colon cancer screening Colonoscopy 2018 with diverticulosis. Repeat colonoscopy in 5 years for surveillance ( based on personal history of colonic adenoma 2012 and mother' s history of an advanced colon polyp) .   Vanessa Patel [facility] 02/27/2024, 1:50 PM  Cc: Redmon, Noelle, PA "

## 2024-02-27 NOTE — Patient Instructions (Addendum)
 Start taking Miralax 1 capful (17 grams) 1x / day for 1 week.   If this is not effective, increase to 1 dose 2x / day for 1 week.   If this is still not effective, increase to two capfuls (34 grams) 2x / day.   Can adjust dose as needed based on response. Can take 1/2 cap daily, skip days, or increase per day.    You have been scheduled for an endoscopy and colonoscopy. Please follow the written instructions given to you at your visit today.  If you use inhalers (even only as needed), please bring them with you on the day of your procedure.  DO NOT TAKE 7 DAYS PRIOR TO TEST- Trulicity (dulaglutide) Ozempic, Wegovy (semaglutide) Mounjaro, Zepbound (tirzepatide) Bydureon Bcise (exanatide extended release)  DO NOT TAKE 1 DAY PRIOR TO YOUR TEST Rybelsus (semaglutide) Adlyxin (lixisenatide) Victoza (liraglutide) Byetta (exanatide) ___________________________________________________________________________ _______________________________________________________  If your blood pressure at your visit was 140/90 or greater, please contact your primary care physician to follow up on this.  _______________________________________________________  If you are age 11 or older, your body mass index should be between 23-30. Your Body mass index is 24.53 kg/m. If this is out of the aforementioned range listed, please consider follow up with your Primary Care Provider.  If you are age 53 or younger, your body mass index should be between 19-25. Your Body mass index is 24.53 kg/m. If this is out of the aformentioned range listed, please consider follow up with your Primary Care Provider.   ________________________________________________________  The [facility] providers would like to encourage you to use MYCHART to communicate with providers for non-urgent requests or questions.  Due to long hold times on the telephone, sending your provider a message by [facility] may be a faster and more efficient  way to get a response.  Please allow 48 business hours for a response.  Please remember that this is for non-urgent requests.  _______________________________________________________  [facility] is using a team-based approach to care.  Your team is made up of your doctor and two to three APPS. Our APPS (Nurse Practitioners and Physician Assistants) work with your physician to ensure care continuity for you. They are fully qualified to address your health concerns and develop a treatment plan. They communicate directly with your gastroenterologist to care for you. Seeing the Advanced Practice Practitioners on your physician's team can help you by facilitating care more promptly, often allowing for earlier appointments, access to diagnostic testing, procedures, and other specialty referrals.

## 2024-04-04 ENCOUNTER — Encounter: Admitting: Internal Medicine
